# Patient Record
Sex: Female | Born: 1960 | Race: White | Hispanic: No | Marital: Married | Smoking: Never smoker
Health system: Southern US, Community
[De-identification: ages and names within clinical notes are randomized; demographics above are authoritative.]

## PROBLEM LIST (undated history)

## (undated) DIAGNOSIS — E785 Hyperlipidemia, unspecified: Secondary | ICD-10-CM

## (undated) DIAGNOSIS — M810 Age-related osteoporosis without current pathological fracture: Secondary | ICD-10-CM

## (undated) HISTORY — DX: Age-related osteoporosis without current pathological fracture: M81.0

## (undated) HISTORY — DX: Hyperlipidemia, unspecified: E78.5

---

## 1998-02-15 ENCOUNTER — Other Ambulatory Visit: Admission: RE | Admit: 1998-02-15 | Discharge: 1998-02-15 | Payer: Self-pay | Admitting: Obstetrics and Gynecology

## 1998-10-07 ENCOUNTER — Ambulatory Visit (HOSPITAL_COMMUNITY): Admission: RE | Admit: 1998-10-07 | Discharge: 1998-10-07 | Payer: Self-pay | Admitting: Obstetrics and Gynecology

## 1999-03-15 ENCOUNTER — Other Ambulatory Visit: Admission: RE | Admit: 1999-03-15 | Discharge: 1999-03-15 | Payer: Self-pay | Admitting: Obstetrics and Gynecology

## 1999-10-10 ENCOUNTER — Encounter: Payer: Self-pay | Admitting: Obstetrics and Gynecology

## 1999-10-10 ENCOUNTER — Ambulatory Visit (HOSPITAL_COMMUNITY): Admission: RE | Admit: 1999-10-10 | Discharge: 1999-10-10 | Payer: Self-pay | Admitting: Obstetrics and Gynecology

## 2000-04-26 ENCOUNTER — Other Ambulatory Visit: Admission: RE | Admit: 2000-04-26 | Discharge: 2000-04-26 | Payer: Self-pay | Admitting: Obstetrics and Gynecology

## 2000-09-23 ENCOUNTER — Encounter (INDEPENDENT_AMBULATORY_CARE_PROVIDER_SITE_OTHER): Payer: Self-pay | Admitting: Specialist

## 2000-09-23 ENCOUNTER — Ambulatory Visit (HOSPITAL_COMMUNITY): Admission: RE | Admit: 2000-09-23 | Discharge: 2000-09-23 | Payer: Self-pay | Admitting: Gastroenterology

## 2000-11-13 ENCOUNTER — Encounter: Payer: Self-pay | Admitting: Obstetrics and Gynecology

## 2000-11-13 ENCOUNTER — Ambulatory Visit (HOSPITAL_COMMUNITY): Admission: RE | Admit: 2000-11-13 | Discharge: 2000-11-13 | Payer: Self-pay

## 2000-11-25 ENCOUNTER — Encounter: Admission: RE | Admit: 2000-11-25 | Discharge: 2000-11-25 | Payer: Self-pay | Admitting: Obstetrics and Gynecology

## 2000-11-25 ENCOUNTER — Encounter: Payer: Self-pay | Admitting: Obstetrics and Gynecology

## 2001-06-03 ENCOUNTER — Other Ambulatory Visit: Admission: RE | Admit: 2001-06-03 | Discharge: 2001-06-03 | Payer: Self-pay | Admitting: Obstetrics and Gynecology

## 2002-01-01 ENCOUNTER — Ambulatory Visit (HOSPITAL_COMMUNITY): Admission: RE | Admit: 2002-01-01 | Discharge: 2002-01-01 | Payer: Self-pay | Admitting: Obstetrics and Gynecology

## 2002-01-01 ENCOUNTER — Encounter: Payer: Self-pay | Admitting: Obstetrics and Gynecology

## 2002-01-15 ENCOUNTER — Encounter: Admission: RE | Admit: 2002-01-15 | Discharge: 2002-01-15 | Payer: Self-pay | Admitting: Obstetrics and Gynecology

## 2002-01-15 ENCOUNTER — Encounter: Payer: Self-pay | Admitting: Obstetrics and Gynecology

## 2002-06-22 ENCOUNTER — Other Ambulatory Visit: Admission: RE | Admit: 2002-06-22 | Discharge: 2002-06-22 | Payer: Self-pay | Admitting: Obstetrics and Gynecology

## 2003-01-22 ENCOUNTER — Ambulatory Visit (HOSPITAL_COMMUNITY): Admission: RE | Admit: 2003-01-22 | Discharge: 2003-01-22 | Payer: Self-pay | Admitting: Obstetrics and Gynecology

## 2003-01-22 ENCOUNTER — Encounter: Payer: Self-pay | Admitting: Obstetrics and Gynecology

## 2003-06-28 ENCOUNTER — Other Ambulatory Visit: Admission: RE | Admit: 2003-06-28 | Discharge: 2003-06-28 | Payer: Self-pay | Admitting: Obstetrics and Gynecology

## 2004-01-14 ENCOUNTER — Encounter: Admission: RE | Admit: 2004-01-14 | Discharge: 2004-01-14 | Payer: Self-pay | Admitting: *Deleted

## 2004-01-27 ENCOUNTER — Encounter (INDEPENDENT_AMBULATORY_CARE_PROVIDER_SITE_OTHER): Payer: Self-pay | Admitting: *Deleted

## 2004-01-27 ENCOUNTER — Ambulatory Visit (HOSPITAL_COMMUNITY): Admission: RE | Admit: 2004-01-27 | Discharge: 2004-01-27 | Payer: Self-pay | Admitting: Gastroenterology

## 2004-08-14 ENCOUNTER — Other Ambulatory Visit: Admission: RE | Admit: 2004-08-14 | Discharge: 2004-08-14 | Payer: Self-pay | Admitting: Obstetrics and Gynecology

## 2005-01-18 ENCOUNTER — Ambulatory Visit (HOSPITAL_COMMUNITY): Admission: RE | Admit: 2005-01-18 | Discharge: 2005-01-18 | Payer: Self-pay | Admitting: Obstetrics and Gynecology

## 2005-09-26 ENCOUNTER — Other Ambulatory Visit: Admission: RE | Admit: 2005-09-26 | Discharge: 2005-09-26 | Payer: Self-pay | Admitting: Obstetrics and Gynecology

## 2006-01-23 ENCOUNTER — Ambulatory Visit (HOSPITAL_COMMUNITY): Admission: RE | Admit: 2006-01-23 | Discharge: 2006-01-23 | Payer: Self-pay | Admitting: Obstetrics and Gynecology

## 2007-01-28 ENCOUNTER — Ambulatory Visit (HOSPITAL_COMMUNITY): Admission: RE | Admit: 2007-01-28 | Discharge: 2007-01-28 | Payer: Self-pay | Admitting: Obstetrics and Gynecology

## 2007-01-31 ENCOUNTER — Encounter: Admission: RE | Admit: 2007-01-31 | Discharge: 2007-01-31 | Payer: Self-pay | Admitting: Orthopedic Surgery

## 2007-03-03 ENCOUNTER — Encounter: Admission: RE | Admit: 2007-03-03 | Discharge: 2007-03-03 | Payer: Self-pay | Admitting: Orthopedic Surgery

## 2008-02-02 ENCOUNTER — Ambulatory Visit (HOSPITAL_COMMUNITY): Admission: RE | Admit: 2008-02-02 | Discharge: 2008-02-02 | Payer: Self-pay | Admitting: Obstetrics and Gynecology

## 2008-02-06 ENCOUNTER — Encounter: Admission: RE | Admit: 2008-02-06 | Discharge: 2008-02-06 | Payer: Self-pay | Admitting: Obstetrics and Gynecology

## 2009-02-02 ENCOUNTER — Encounter: Admission: RE | Admit: 2009-02-02 | Discharge: 2009-02-02 | Payer: Self-pay | Admitting: Obstetrics and Gynecology

## 2009-02-08 ENCOUNTER — Encounter: Admission: RE | Admit: 2009-02-08 | Discharge: 2009-02-08 | Payer: Self-pay | Admitting: Obstetrics and Gynecology

## 2010-02-14 ENCOUNTER — Ambulatory Visit (HOSPITAL_COMMUNITY): Admission: RE | Admit: 2010-02-14 | Discharge: 2010-02-14 | Payer: Self-pay | Admitting: Obstetrics and Gynecology

## 2010-10-08 ENCOUNTER — Encounter: Payer: Self-pay | Admitting: Obstetrics and Gynecology

## 2010-10-09 ENCOUNTER — Encounter: Payer: Self-pay | Admitting: Obstetrics and Gynecology

## 2011-01-12 ENCOUNTER — Other Ambulatory Visit (HOSPITAL_COMMUNITY): Payer: Self-pay | Admitting: Obstetrics and Gynecology

## 2011-01-12 DIAGNOSIS — Z1231 Encounter for screening mammogram for malignant neoplasm of breast: Secondary | ICD-10-CM

## 2011-02-02 NOTE — Procedures (Signed)
[facility]  Patient:    Tina Woodard, Tina Woodard                      MRN: [id number] Proc. Date: 09/23/00 Adm. Date:  [id number] Attending:  Rich Brave CC:         Duke Salvia. Marcelle Overlie, M.D.   Procedure Report  PROCEDURE PERFORMED:  Colonoscopy with biopsies and polypectomy.  ENDOSCOPIST:  Florencia Reasons, M.D.  INDICATIONS:  A 50 year old female with long-standing ulcerative colitis, in active times many years and off all medications for several years.  FINDINGS:  Three small polyps.  No evidence of active ulcerative colitis.  DESCRIPTION OF PROCEDURE:  The nature, purpose, and risk of the procedure had been discussed with the patient and she provided written consent.  Sedation was fentanyl 87.5 mcg and Versed 8 mg IV prior to entering the course of the procedure without arrhythmias or desaturation.  The procedure was initiated with the Olympus pediatric video colonoscope, but this was too flexible and I was unable to get beyond roughly the splenic flexure, so we switched to the adult colonoscope.  Even with that, the patient appeared to have a somewhat redundant and floppy colon which tended to form quite a few loops.  By having the patient in the supine position and applying suprapubic abdominal pressure, we were able to get around the last couple of turns and then advanced quite easily into the cecum and for a short distance into a normal appearing terminal ileum, where upon pull back was initiated. The quality of the prep was excellent and it is felt that all areas were well seen.  There were three polyps removed during this exam.  All were small and sessile. The first was a 3 mm semipedunculated polyp near the splenic flexure which I snared off and the second was a small flatter 4 mm polyp snared off near the hepatic flexure.  There was complete hemostasis and no evidence of excessive cautery at these sites.  There was also a diminutive  hyperplastic appearing 2-3 mm polyp removed by a single colon biopsy at about 25 cm.  There was no evidence of any past or present ulcerative colitis.  The colonic mucosa looked completely normal, in particular in the rectal area where there was a very normal-appearing vascular pattern.  No erythema, friability, exudate, granularity or other abnormalities were seen.  There was no diverticulosis. Retroflexion was not performed due to a small rectal ampulla.  Random mucosal biopsies were obtained along the length of the colon.  This is check for evidence of dysplasia.  The scope is then removed from the patient who tolerated the procedure well and without apparent complications.  IMPRESSION: 1. Three small polyps removed as described above. 2. No evidence of active ulcerative colitis colonoscopically. 3. Pathology pending on random biopsies.  PLAN:  Unless dysplasia is present, I would think we can wait somewhere between two and three years before next performing a colonoscopy on this patient.  Perhaps we could wait even longer in view of her benign clinical course, would suggest that she might be a somewhat less risk for ulcerative colitis than even the average patient with ulcerative colitis. DD:  09/23/00 TD:  09/23/00 Job: [id number]

## 2011-02-02 NOTE — Op Note (Signed)
Tina Woodard, Tina Woodard                         ACCOUNT NO.:  [id number]   MEDICAL RECORD NO.:  [id number]                   PATIENT TYPE:  AMB   LOCATION:  ENDO                                 FACILITY:  [facility]   PHYSICIAN:  Bernette Redbird, M.D.                DATE OF BIRTH:  1961-07-18   DATE OF PROCEDURE:  01/27/2004  DATE OF DISCHARGE:                                 OPERATIVE REPORT   PROCEDURE:  Colonoscopy with biopsies.   INDICATION:  Clinically quiescent ulcerative colitis of longstanding, off  medications for many years without clinical recurrence, for dysplasia  surveillance.  Most recent colonoscopy, about 3 years ago, was negative for  dysplasia.   FINDINGS:  Normal exam to the terminal ileum.  Multiple biopsies obtained.   DESCRIPTION OF PROCEDURE:  The nature, purpose, and risks of the procedure  were familiar to the patient from prior examinations, and she provided  written consent.  Sedation was fentanyl 75 mcg, Versed 9 mg IV without  arrhythmias or desaturation.  The Olympus adult video colonoscope was used  for this procedure based on previous experience indicating that the  pediatric colonoscope was too flexible.  With external abdominal compression  and the patient in the supine position, I reached the cecum and entered the  terminal ileum for a short distance.  It had a normal appearance, and  pullback was then performed around the colon.   This was a normal examination.  No polyps, cancer, colitis, vascular  malformations, or diverticulosis were noted.  In particular, the vascular  mucosal pattern was entirely normal without any evidence of ongoing colitis  or proctitis.  Retroflexion was not performed in the rectum, but  reinspection of the rectum was unremarkable.  Random mucosal biopsies were  obtained along the length of the colon.   The patient tolerated the procedure well, and there were no apparent  complications.   IMPRESSION:  Normal exam.  No  evidence of active colitis.   PLAN:  Await biopsies for dysplasia surveillance.  Probable colonoscopic  follow up in 3 years or so (in view of the clinical inactivity, the patient  would prefer less frequent surveillance exams than the standard 2 year  interval).                                               Bernette Redbird, M.D.    RB/MEDQ  D:  01/27/2004  T:  01/27/2004  Job:  [id number]   cc:   Duke Salvia. Marcelle Overlie, M.D.  [address] [place]  [place] [address]  Fax: [phone]

## 2011-02-21 ENCOUNTER — Ambulatory Visit (HOSPITAL_COMMUNITY): Admission: RE | Admit: 2011-02-21 | Payer: BC Managed Care – PPO | Source: Ambulatory Visit

## 2011-03-01 ENCOUNTER — Ambulatory Visit (HOSPITAL_COMMUNITY)
Admission: RE | Admit: 2011-03-01 | Discharge: 2011-03-01 | Disposition: A | Discharge status: Home / Self Care | Payer: BC Managed Care – PPO | Source: Ambulatory Visit | Attending: Obstetrics and Gynecology | Admitting: Obstetrics and Gynecology

## 2011-03-01 DIAGNOSIS — Z1231 Encounter for screening mammogram for malignant neoplasm of breast: Secondary | ICD-10-CM | POA: Insufficient documentation

## 2012-01-14 ENCOUNTER — Other Ambulatory Visit: Payer: Self-pay | Admitting: Gastroenterology

## 2012-02-06 ENCOUNTER — Other Ambulatory Visit (HOSPITAL_COMMUNITY): Payer: Self-pay | Admitting: Obstetrics and Gynecology

## 2012-02-06 DIAGNOSIS — Z1231 Encounter for screening mammogram for malignant neoplasm of breast: Secondary | ICD-10-CM

## 2012-03-03 ENCOUNTER — Ambulatory Visit (HOSPITAL_COMMUNITY)
Admission: RE | Admit: 2012-03-03 | Discharge: 2012-03-03 | Disposition: A | Discharge status: Home / Self Care | Payer: BC Managed Care – PPO | Source: Ambulatory Visit | Attending: Obstetrics and Gynecology | Admitting: Obstetrics and Gynecology

## 2012-03-03 DIAGNOSIS — Z1231 Encounter for screening mammogram for malignant neoplasm of breast: Secondary | ICD-10-CM | POA: Insufficient documentation

## 2013-03-23 ENCOUNTER — Other Ambulatory Visit (HOSPITAL_COMMUNITY): Payer: Self-pay | Admitting: Obstetrics and Gynecology

## 2013-03-23 DIAGNOSIS — Z1231 Encounter for screening mammogram for malignant neoplasm of breast: Secondary | ICD-10-CM

## 2013-04-02 ENCOUNTER — Ambulatory Visit (HOSPITAL_COMMUNITY)
Admission: RE | Admit: 2013-04-02 | Discharge: 2013-04-02 | Disposition: A | Discharge status: Home / Self Care | Payer: BC Managed Care – PPO | Source: Ambulatory Visit | Attending: Obstetrics and Gynecology | Admitting: Obstetrics and Gynecology

## 2013-04-02 DIAGNOSIS — Z1231 Encounter for screening mammogram for malignant neoplasm of breast: Secondary | ICD-10-CM | POA: Insufficient documentation

## 2014-03-01 ENCOUNTER — Other Ambulatory Visit (HOSPITAL_COMMUNITY): Payer: Self-pay | Admitting: Obstetrics and Gynecology

## 2014-03-01 DIAGNOSIS — Z1231 Encounter for screening mammogram for malignant neoplasm of breast: Secondary | ICD-10-CM

## 2014-04-13 ENCOUNTER — Ambulatory Visit (HOSPITAL_COMMUNITY): Payer: BC Managed Care – PPO

## 2014-04-19 ENCOUNTER — Ambulatory Visit (HOSPITAL_COMMUNITY)
Admission: RE | Admit: 2014-04-19 | Discharge: 2014-04-19 | Disposition: A | Discharge status: Home / Self Care | Payer: BC Managed Care – PPO | Source: Ambulatory Visit | Attending: Obstetrics and Gynecology | Admitting: Obstetrics and Gynecology

## 2014-04-19 DIAGNOSIS — Z1231 Encounter for screening mammogram for malignant neoplasm of breast: Secondary | ICD-10-CM | POA: Insufficient documentation

## 2015-01-28 ENCOUNTER — Other Ambulatory Visit: Payer: Self-pay | Admitting: Obstetrics and Gynecology

## 2015-01-31 LAB — CYTOLOGY - PAP

## 2015-04-13 ENCOUNTER — Other Ambulatory Visit (HOSPITAL_COMMUNITY): Payer: Self-pay | Admitting: Obstetrics and Gynecology

## 2015-04-13 DIAGNOSIS — Z1231 Encounter for screening mammogram for malignant neoplasm of breast: Secondary | ICD-10-CM

## 2015-04-22 ENCOUNTER — Ambulatory Visit (HOSPITAL_COMMUNITY)
Admission: RE | Admit: 2015-04-22 | Discharge: 2015-04-22 | Disposition: A | Discharge status: Home / Self Care | Payer: BC Managed Care – PPO | Source: Ambulatory Visit | Attending: Obstetrics and Gynecology | Admitting: Obstetrics and Gynecology

## 2015-04-22 DIAGNOSIS — Z1231 Encounter for screening mammogram for malignant neoplasm of breast: Secondary | ICD-10-CM | POA: Insufficient documentation

## 2016-04-03 ENCOUNTER — Encounter: Payer: Self-pay | Admitting: Family Medicine

## 2016-04-03 ENCOUNTER — Ambulatory Visit (INDEPENDENT_AMBULATORY_CARE_PROVIDER_SITE_OTHER): Payer: BC Managed Care – PPO | Admitting: Family Medicine

## 2016-04-03 VITALS — BP 109/76 | HR 75 | Ht 67.0 in | Wt 135.0 lb

## 2016-04-03 DIAGNOSIS — M7662 Achilles tendinitis, left leg: Secondary | ICD-10-CM | POA: Diagnosis not present

## 2016-04-03 MED ORDER — NITROGLYCERIN 0.2 MG/HR TD PT24: MEDICATED_PATCH | TRANSDERMAL | Status: DC

## 2016-04-03 NOTE — Patient Instructions (Addendum)
You have Achilles Tendinitis Ibuprofen 600mg  three times a day with food OR aleve 2 tabs twice a day with food for pain and inflammation. Calf raises 3 sets of 10 on level ground once a day first. When these are easy, can do them one legged 3 sets of 10. Finally advance to doing them on a step. Can add heel walks, toe walks forward and backward as well Hold stretch for 20-30 seconds, repeat 3 times once or twice a day. Icing 15 minutes at a time 3-4 times a day. Avoid uneven ground, hills as much as possible. Focus on swimming, cycling for exercise. Nitro patches 1/4th patch to affected achilles, change daily. Try bunion pads over the back of this to help ease the compression/rubbing on the bump you have here. I would rest this as much as possible. Heel lifts in shoes or shoes with a natural heel lift. Consider physical therapy, orthotics if not improving as expected. Follow up in 6 weeks.

## 2016-04-04 ENCOUNTER — Encounter: Payer: Self-pay | Admitting: Family Medicine

## 2016-04-04 DIAGNOSIS — M7662 Achilles tendinitis, left leg: Secondary | ICD-10-CM | POA: Insufficient documentation

## 2016-04-04 NOTE — Assessment & Plan Note (Signed)
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  Ibuprofen or aleve, icing regularly.  Shown home exercises to do daily.  Heel lifts.  Avoid flat shoes, barefoot walking.  Start nitro patches - discussed risks of headache, skin irritation.  Try a bunion pad to cushion area.  Consider physical therapy, orthotics if not improving.  F/u in 6 weeks.

## 2016-04-04 NOTE — Progress Notes (Signed)
PCP: Dr Merri Brunetteandace Smith   Subjective:   HPI: Patient is a 55 y.o. female here for left heel pain.  Patient reports for about 8-10 days she's had worsening left posterior heel pain. Pain is 0/10 at rest, up to 3/10 with pressure on this area and with a lot of walking, sharp. No injury or trauma. Has been walking 8-10 miles wearing hiking boots once a week in preparation for hiking in [place] in 2 weeks. Has been stretching, icing, resting. No skin changes, numbness. Remote history of plantar fasciitis.  Past Medical History  Diagnosis Date  . Hyperlipidemia   . Osteoporosis     No current outpatient prescriptions on file prior to visit.   No current facility-administered medications on file prior to visit.    No past surgical history on file.  No Known Allergies  Social History   Social History  . Marital Status: Married    Spouse Name: N/A  . Number of Children: N/A  . Years of Education: N/A   Occupational History  . Not on file.   Social History Main Topics  . Smoking status: Never Smoker   . Smokeless tobacco: Not on file  . Alcohol Use: Not on file  . Drug Use: Not on file  . Sexual Activity: Not on file   Other Topics Concern  . Not on file   Social History Narrative    No family history on file.  BP 109/76 mmHg  Pulse 75  Ht 5\' 7"  (1.702 m)  Wt 135 lb (61.236 kg)  BMI 21.14 kg/m2  LMP 02/24/2012  Review of Systems: See HPI above.    Objective:  Physical Exam:  Gen: NAD, comfortable in exam room  Left foot/ankle: Haglunds deformity.  No bruising, swelling, other deformity. FROM with pain on full plantarflexion and dorsiflexion. TTP insertion of achilles posteriorly.  No other tenderness. Negative ant drawer and talar tilt.   Negative syndesmotic compression. Negative calcaneal squeeze. Thompsons test negative. NV intact distally.    Right foot/ankle: FROM without pain.  Assessment & Plan:  1. Left insertional achilles tendinitis -  with haglund's deformity.  Ibuprofen or aleve, icing regularly.  Shown home exercises to do daily.  Heel lifts.  Avoid flat shoes, barefoot walking.  Start nitro patches - discussed risks of headache, skin irritation.  Try a bunion pad to cushion area.  Consider physical therapy, orthotics if not improving.  F/u in 6 weeks.

## 2016-05-16 ENCOUNTER — Ambulatory Visit: Payer: BC Managed Care – PPO | Admitting: Family Medicine

## 2019-03-24 DIAGNOSIS — K589 Irritable bowel syndrome without diarrhea: Secondary | ICD-10-CM | POA: Insufficient documentation

## 2021-08-24 ENCOUNTER — Emergency Department (HOSPITAL_COMMUNITY): Payer: BC Managed Care – PPO

## 2021-08-24 ENCOUNTER — Other Ambulatory Visit: Payer: Self-pay

## 2021-08-24 ENCOUNTER — Encounter (HOSPITAL_COMMUNITY): Payer: Self-pay

## 2021-08-24 ENCOUNTER — Observation Stay (HOSPITAL_COMMUNITY)
Admission: EM | Admit: 2021-08-24 | Discharge: 2021-08-25 | Disposition: A | Discharge status: Home / Self Care | Payer: BC Managed Care – PPO | Attending: General Surgery | Admitting: General Surgery

## 2021-08-24 DIAGNOSIS — S301XXA Contusion of abdominal wall, initial encounter: Secondary | ICD-10-CM | POA: Diagnosis not present

## 2021-08-24 DIAGNOSIS — D62 Acute posthemorrhagic anemia: Secondary | ICD-10-CM | POA: Insufficient documentation

## 2021-08-24 DIAGNOSIS — Y92410 Unspecified street and highway as the place of occurrence of the external cause: Secondary | ICD-10-CM | POA: Insufficient documentation

## 2021-08-24 DIAGNOSIS — S3991XA Unspecified injury of abdomen, initial encounter: Secondary | ICD-10-CM | POA: Diagnosis present

## 2021-08-24 LAB — COMPREHENSIVE METABOLIC PANEL
ALT: 25 U/L (ref 0–44)
AST: 40 U/L (ref 15–41)
Albumin: 4.4 g/dL (ref 3.5–5.0)
Alkaline Phosphatase: 57 U/L (ref 38–126)
Anion gap: 12 (ref 5–15)
BUN: 17 mg/dL (ref 8–23)
CO2: 20 mmol/L — ABNORMAL LOW (ref 22–32)
Calcium: 8.9 mg/dL (ref 8.9–10.3)
Chloride: 104 mmol/L (ref 98–111)
Creatinine, Ser: 0.78 mg/dL (ref 0.44–1.00)
GFR, Estimated: >60 mL/min (ref >60)
Glucose, Bld: 97 mg/dL (ref 70–99)
Potassium: 4.3 mmol/L (ref 3.5–5.1)
Sodium: 136 mmol/L (ref 135–145)
Total Bilirubin: 0.9 mg/dL (ref 0.3–1.2)
Total Protein: 7.2 g/dL (ref 6.5–8.1)

## 2021-08-24 LAB — CBC WITH DIFFERENTIAL/PLATELET
Abs Immature Granulocytes: 0.13 10*3/uL — ABNORMAL HIGH (ref 0.00–0.07)
Basophils Absolute: 0.1 10*3/uL (ref 0.0–0.1)
Basophils Relative: 1 %
Eosinophils Absolute: 0 10*3/uL (ref 0.0–0.5)
Eosinophils Relative: 0 %
HCT: 43.4 % (ref 36.0–46.0)
Hemoglobin: 13.8 g/dL (ref 12.0–15.0)
Immature Granulocytes: 1 %
Lymphocytes Relative: 7 %
Lymphs Abs: 1.2 10*3/uL (ref 0.7–4.0)
MCH: 31.7 pg (ref 26.0–34.0)
MCHC: 31.8 g/dL (ref 30.0–36.0)
MCV: 99.5 fL (ref 80.0–100.0)
Monocytes Absolute: 0.8 10*3/uL (ref 0.1–1.0)
Monocytes Relative: 5 %
Neutro Abs: 15 10*3/uL — ABNORMAL HIGH (ref 1.7–7.7)
Neutrophils Relative %: 86 %
Platelets: 252 10*3/uL (ref 150–400)
RBC: 4.36 MIL/uL (ref 3.87–5.11)
RDW: 14.8 % (ref 11.5–15.5)
WBC: 17.2 10*3/uL — ABNORMAL HIGH (ref 4.0–10.5)
nRBC: 0 % (ref 0.0–0.2)

## 2021-08-24 LAB — TYPE AND SCREEN
ABO/RH(D): O POS
Antibody Screen: NEGATIVE

## 2021-08-24 MED ORDER — ONDANSETRON HCL 4 MG/2ML IJ SOLN: 4.0000 mg | Freq: Four times a day (QID) | INTRAMUSCULAR | Status: DC | PRN

## 2021-08-24 MED ORDER — HYDROMORPHONE HCL 1 MG/ML IJ SOLN: 1.0000 mg | INTRAMUSCULAR | Status: DC | PRN

## 2021-08-24 MED ORDER — IOHEXOL 350 MG/ML SOLN
80.0000 mL | Freq: Once | INTRAVENOUS | Status: AC | PRN
  Administered 2021-08-24: 80 mL via INTRAVENOUS

## 2021-08-24 MED ORDER — SODIUM CHLORIDE 0.9 % IV BOLUS
1000.0000 mL | Freq: Once | INTRAVENOUS | Status: AC
  Administered 2021-08-24: 1000 mL via INTRAVENOUS

## 2021-08-24 MED ORDER — DEXTROSE-NACL 5-0.9 % IV SOLN: INTRAVENOUS | Status: DC

## 2021-08-24 MED ORDER — OXYCODONE HCL 5 MG PO TABS: 5.0000 mg | ORAL_TABLET | ORAL | Status: DC | PRN

## 2021-08-24 MED ORDER — MELATONIN 3 MG PO TABS: 3.0000 mg | ORAL_TABLET | Freq: Every evening | ORAL | Status: DC | PRN

## 2021-08-24 MED ORDER — ONDANSETRON 4 MG PO TBDP: 4.0000 mg | ORAL_TABLET | Freq: Four times a day (QID) | ORAL | Status: DC | PRN
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
## 2021-08-24 NOTE — ED Notes (Signed)
Pt taken to CT.
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
## 2021-08-25 ENCOUNTER — Encounter (HOSPITAL_COMMUNITY): Payer: Self-pay

## 2021-08-25 LAB — HIV ANTIBODY (ROUTINE TESTING W REFLEX): HIV Screen 4th Generation wRfx: NONREACTIVE

## 2021-08-25 LAB — ABO/RH: ABO/RH(D): O POS

## 2021-08-25 LAB — CBC
HCT: 34.3 % — ABNORMAL LOW (ref 36.0–46.0)
Hemoglobin: 11.1 g/dL — ABNORMAL LOW (ref 12.0–15.0)
MCH: 31.3 pg (ref 26.0–34.0)
MCHC: 32.4 g/dL (ref 30.0–36.0)
MCV: 96.6 fL (ref 80.0–100.0)
Platelets: 220 10*3/uL (ref 150–400)
RBC: 3.55 MIL/uL — ABNORMAL LOW (ref 3.87–5.11)
RDW: 13.2 % (ref 11.5–15.5)
WBC: 7 10*3/uL (ref 4.0–10.5)
nRBC: 0 % (ref 0.0–0.2)

## 2021-08-25 MED ORDER — OXYCODONE HCL 5 MG PO TABS: 5.0000 mg | ORAL_TABLET | Freq: Four times a day (QID) | ORAL | 0 refills | Status: AC | PRN

## 2021-08-25 MED ORDER — ACETAMINOPHEN 325 MG PO TABS: 650.0000 mg | ORAL_TABLET | Freq: Four times a day (QID) | ORAL | Status: DC | PRN

## 2021-08-25 MED ORDER — ACETAMINOPHEN 325 MG PO TABS: 650.0000 mg | ORAL_TABLET | Freq: Four times a day (QID) | ORAL | Status: AC | PRN

## 2021-08-25 NOTE — Discharge Instructions (Signed)
Continue ice packs to abdominal wall hematoma and wear abdominal binder for compression Bruising on skin may look worse before it looks better Monitor for signs of anemia/ blood loss (fatigue, dizziness/ lightheadedness, racing heart beat, low blood pressure) Follow up with your primary care physician
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
## 2022-05-24 NOTE — Progress Notes (Signed)
[facility] - Clinic Note  05/25/2022     CHIEF COMPLAINT Patient presents for Retina Evaluation   HISTORY OF PRESENT ILLNESS: Tina Woodard is a 61 y.o. female who presents to the clinic today for:   HPI     Retina Evaluation   In both eyes.  Associated Symptoms Floaters.  I, the attending physician,  performed the HPI with the patient and updated documentation appropriately.        Comments   Patient here for Retinal Evaluation. Referred by Dr Zenaida Niece. Patient states vision doing alright. Has had more floaters the last year or 2. No eye pain.      Last edited by Rennis Chris, MD on 05/25/2022 11:59 PM.    Patient referred by Dr. Zenaida Niece for possible retinal hole. Has had more floaters in the last 1-2 years. Patient had flashes a couple of years ago, but reports no recent symptoms of flashes.    Referring physician: Diona Foley, MD [address] [place],  [place] [address]  HISTORICAL INFORMATION:   Selected notes from the MEDICAL RECORD NUMBER Referred by Dr. Zenaida Niece for concern of retinal hole LEE:  Ocular Hx- PMH-    CURRENT MEDICATIONS: No current outpatient medications on file. (Ophthalmic Drugs)   No current facility-administered medications for this visit. (Ophthalmic Drugs)   Current Outpatient Medications (Other)  Medication Sig   cholecalciferol (VITAMIN D3) 25 MCG (1000 UNIT) tablet Take 1,000 Units by mouth daily.   MAGNESIUM PO Take 250-500 mg by mouth daily.   Multiple Vitamin (MULTIVITAMIN WITH MINERALS) TABS tablet Take 1 tablet by mouth daily.   pravastatin (PRAVACHOL) 20 MG tablet Take 20 mg by mouth daily.   acetaminophen (TYLENOL) 325 MG tablet Take 2 tablets (650 mg total) by mouth every 6 (six) hours as needed for mild pain. (Patient not taking: Reported on 05/25/2022)   oxyCODONE (OXY IR/ROXICODONE) 5 MG immediate release tablet Take 1 tablet (5 mg total) by mouth every 6 (six) hours as needed for severe pain. (Patient not  taking: Reported on 05/25/2022)   No current facility-administered medications for this visit. (Other)   REVIEW OF SYSTEMS: ROS   Positive for: Gastrointestinal, Musculoskeletal, Eyes Negative for: Constitutional, Neurological, Skin, Genitourinary, HENT, Endocrine, Cardiovascular, Respiratory, Psychiatric, Allergic/Imm, Heme/Lymph Last edited by Annalee Genta D, COT on 05/25/2022 11:25 AM.     ALLERGIES No Known Allergies  PAST MEDICAL HISTORY Past Medical History:  Diagnosis Date   Hyperlipidemia    Osteoporosis    History reviewed. No pertinent surgical history.  FAMILY HISTORY Family History  Problem Relation Age of Onset   Diabetes Sister    Glaucoma Paternal Grandmother    Diabetes Paternal Grandmother    Glaucoma Paternal Grandfather    SOCIAL HISTORY Social History   Tobacco Use   Smoking status: Never  Vaping Use   Vaping Use: Never used  Substance Use Topics   Alcohol use: Yes    Comment: social   Drug use: Never       OPHTHALMIC EXAM:  Base Eye Exam     Visual Acuity (Snellen - Linear)       Right Left   Dist cc 20/20 20/40   Dist ph cc  20/20    Correction: Glasses  Usually wears soft contact lenses.        Tonometry (Tonopen, 9:34 AM)       Right Left   Pressure 15 13  Pupils       Dark Light Shape React APD   Right 3 2 Round Brisk None   Left 3 2 Round Brisk None         Visual Fields (Counting fingers)       Left Right    Full Full         Extraocular Movement       Right Left    Full, Ortho Full, Ortho         Neuro/Psych     Oriented x3: Yes   Mood/Affect: Normal         Dilation     Both eyes: 1.0% Mydriacyl, 2.5% Phenylephrine @ 9:34 AM           Slit Lamp and Fundus Exam     Slit Lamp Exam       Right Left   Lids/Lashes dermatochalasis dermatochalasis   Conjunctiva/Sclera white and quiet white and quiet, mild temporal pinguecula   Cornea mild arcus, trace PEE mild arcus, trace  tear film debirs   Anterior Chamber deep and clear deep and clear   Iris round and dilated round and dilated   Lens 2+ NS, 2+ cortical 2+ NS, 2+ cortical   Anterior Vitreous mild syneresis mild syneresis, PVD, weiss ring, vitreous condensations         Fundus Exam       Right Left   Disc pink and sharp, compact pink and sharp, compact, mild PPA   C/D Ratio 0.1 0.2   Macula flat, good foveal reflex, mild RPE mottling, no heme or edema flat, blunted foveal reflex, ERM with striae and lamellar hole   Vessels mild attenuation, mild cooper wiring, A/V crossing changes mild attenuation, mild cooper wiring   Periphery focal pre-retinal heme just above the disc, pigmented lattice at 0430, 0700-0730, focal hypopigmented CR atrophy at 0800 equator Pigmented lattice inferiorly from 0500 to 0700           Refraction     Wearing Rx       Sphere Cylinder Axis Add   Right -5.25 +2.00 098 +2.00   Left -6.75 +1.25 088 +2.00    Age: 23yrs         Manifest Refraction       Sphere Cylinder Axis Dist VA Add   Right -5.25 +2.00 098 20/20 +2.00   Left -6.50 +1.00 088 20/20 +2.00           IMAGING AND PROCEDURES  Imaging and Procedures for 05/25/2022  OCT, Retina - OU - Both Eyes       Right Eye Central Foveal Thickness: 245. Progression has no prior data. Findings include normal foveal contour, no IRF, no SRF (Partial PVD, trace vitreous opacities ).   Left Eye Central Foveal Thickness: 429. Progression has no prior data. Findings include no SRF, abnormal foveal contour, epiretinal membrane, intraretinal fluid, macular hole, preretinal fibrosis.   Notes *Images captured and stored on drive  Diagnosis / Impression:  OD: NFP, no IRF/SRF; partial PVD and trace vitreous opacities OS: ERM with lamellar macular hole with IRF, pucker and PRF; inferior VR tuft caught on widefield   Clinical management:  See below  Abbreviations: NFP - Normal foveal profile. CME - cystoid macular  edema. PED - pigment epithelial detachment. IRF - intraretinal fluid. SRF - subretinal fluid. EZ - ellipsoid zone. ERM - epiretinal membrane. ORA - outer retinal atrophy. ORT - outer retinal tubulation. SRHM - subretinal hyper-reflective material. IRHM -  intraretinal hyper-reflective material            ASSESSMENT/PLAN:    ICD-10-CM   1. Bilateral retinal lattice degeneration  H35.413     2. Retinal hole of both eyes  H33.323     3. Epiretinal membrane (ERM) of left eye  H35.372 OCT, Retina - OU - Both Eyes    4. Lamellar macular hole of left eye  H35.342 OCT, Retina - OU - Both Eyes    5. Combined forms of age-related cataract of both eyes  H25.813      1,2. Lattice degeneration w/ atrophic holes OU - OD: pigmented lattice at 0430, 0700 to 0730, and focal hypopigmented CR atrophy at 0800 equator;  - OS: pigmented lattice inferiorly from 0500 to 0700 - discussed findings, prognosis, and treatment options including observation - recommend non-urgent laser retinopexy OU - pt wishes to proceed with laser--will schedule wk of Sept 18th - f/u wk of 9.18.23 for laser retinopexy OD  3,4. Epiretinal membrane with lamellar hole OS - The natural history, anatomy, potential for loss of vision, and treatment options including vitrectomy techniques and the complications of endophthalmitis, retinal detachment, vitreous hemorrhage, cataract progression and permanent vision loss discussed with the patient. - mild ERM with lamellar hole and cystic changes - BCVA 20/20, asymptomatic, no metamorphopsia - no indication for surgery at this time - monitor for now  5. Mixed cataracts OU - The symptoms of cataract, surgical options, and treatments and risks were discussed with patient. - discussed diagnosis and progression - not yet visually significant - monitor for now  Ophthalmic Meds Ordered this visit:  No orders of the defined types were placed in this encounter.    Return in 10 days (on  06/04/2022) for week of 09.18.23 laser retinopexy OD.  There are no Patient Instructions on file for this visit.  Explained the diagnoses, plan, and follow up with the patient and they expressed understanding.  Patient expressed understanding of the importance of proper follow up care.  This document serves as a record of services personally performed by Karie Chimera, MD, PhD. It was created on their behalf by Glee Arvin. Manson Passey, OA an ophthalmic technician. The creation of this record is the provider's dictation and/or activities during the visit.    Electronically signed by: Glee Arvin. Manson Passey, [place] 09.07.2023 12:10 AM  This document serves as a record of services personally performed by Karie Chimera, MD, PhD. It was created on their behalf by Annalee Genta, COMT. The creation of this record is the provider's dictation and/or activities during the visit.  Electronically signed by: Annalee Genta, COMT 05/26/22 12:10 AM  Karie Chimera, M.D., Ph.D. Diseases & Surgery of the Retina and Vitreous [facility]  I have reviewed the above documentation for accuracy and completeness, and I agree with the above. Karie Chimera, M.D., Ph.D. 05/26/22 12:11 AM  Abbreviations: M myopia (nearsighted); A astigmatism; H hyperopia (farsighted); P presbyopia; Mrx spectacle prescription;  CTL contact lenses; OD right eye; OS left eye; OU both eyes  XT exotropia; ET esotropia; PEK punctate epithelial keratitis; PEE punctate epithelial erosions; DES dry eye syndrome; MGD meibomian gland dysfunction; ATs artificial tears; PFAT's preservative free artificial tears; NSC nuclear sclerotic cataract; PSC posterior subcapsular cataract; ERM epi-retinal membrane; PVD posterior vitreous detachment; RD retinal detachment; DM diabetes mellitus; DR diabetic retinopathy; NPDR non-proliferative diabetic retinopathy; PDR proliferative diabetic retinopathy; CSME clinically significant macular edema; DME diabetic  macular edema; dbh dot  blot hemorrhages; CWS cotton wool spot; POAG primary open angle glaucoma; C/D cup-to-disc ratio; HVF humphrey visual field; GVF goldmann visual field; OCT optical coherence tomography; IOP intraocular pressure; BRVO Branch retinal vein occlusion; CRVO central retinal vein occlusion; CRAO central retinal artery occlusion; BRAO branch retinal artery occlusion; RT retinal tear; SB scleral buckle; PPV pars plana vitrectomy; VH Vitreous hemorrhage; PRP panretinal laser photocoagulation; IVK intravitreal kenalog; VMT vitreomacular traction; MH Macular hole;  NVD neovascularization of the disc; NVE neovascularization elsewhere; AREDS age related eye disease study; ARMD age related macular degeneration; POAG primary open angle glaucoma; EBMD epithelial/anterior basement membrane dystrophy; ACIOL anterior chamber intraocular lens; IOL intraocular lens; PCIOL posterior chamber intraocular lens; Phaco/IOL phacoemulsification with intraocular lens placement; [place] photorefractive keratectomy; LASIK laser assisted in situ keratomileusis; HTN hypertension; DM diabetes mellitus; COPD chronic obstructive pulmonary disease

## 2022-05-25 ENCOUNTER — Ambulatory Visit (INDEPENDENT_AMBULATORY_CARE_PROVIDER_SITE_OTHER): Payer: BC Managed Care – PPO | Admitting: Ophthalmology

## 2022-05-25 ENCOUNTER — Encounter (INDEPENDENT_AMBULATORY_CARE_PROVIDER_SITE_OTHER): Payer: Self-pay | Admitting: Ophthalmology

## 2022-05-25 DIAGNOSIS — H3581 Retinal edema: Secondary | ICD-10-CM

## 2022-05-25 DIAGNOSIS — H33323 Round hole, bilateral: Secondary | ICD-10-CM | POA: Diagnosis not present

## 2022-05-25 DIAGNOSIS — H35342 Macular cyst, hole, or pseudohole, left eye: Secondary | ICD-10-CM | POA: Diagnosis not present

## 2022-05-25 DIAGNOSIS — H25813 Combined forms of age-related cataract, bilateral: Secondary | ICD-10-CM

## 2022-05-25 DIAGNOSIS — H35372 Puckering of macula, left eye: Secondary | ICD-10-CM

## 2022-05-25 DIAGNOSIS — H35413 Lattice degeneration of retina, bilateral: Secondary | ICD-10-CM

## 2022-06-01 NOTE — Progress Notes (Shared)
[facility] Clinic Note  06/04/2022     CHIEF COMPLAINT Patient presents for Retina Follow Up   HISTORY OF PRESENT ILLNESS: Tina Woodard is a 61 y.o. female who presents to the clinic today for:   HPI     Retina Follow Up   Patient presents with  Other.  In right eye.  Severity is moderate.  Duration of 1 week.  Since onset it is stable.  I, the attending physician,  performed the HPI with the patient and updated documentation appropriately.        Comments   Pt here for 2 wk ret f/u for bilateral lattice degen, retinopexy OD today. Pt states VA the same.       Last edited by Bernarda Caffey, MD on 06/04/2022  5:18 PM.    Patient here for laser retinopexy OD today, no change in vision   Referring physician: Carol Ada, MD [address] [place],  [place] [address]  HISTORICAL INFORMATION:   Selected notes from the MEDICAL RECORD NUMBER Referred by Dr. Lucianne Lei for concern of retinal hole LEE:  Ocular Hx- PMH-    CURRENT MEDICATIONS: Current Outpatient Medications (Ophthalmic Drugs)  Medication Sig   prednisoLONE acetate (PRED FORTE) 1 % ophthalmic suspension Place 1 drop into the right eye 4 (four) times daily for 7 days.   No current facility-administered medications for this visit. (Ophthalmic Drugs)   Current Outpatient Medications (Other)  Medication Sig   cholecalciferol (VITAMIN D3) 25 MCG (1000 UNIT) tablet Take 1,000 Units by mouth daily.   MAGNESIUM PO Take 250-500 mg by mouth daily.   Multiple Vitamin (MULTIVITAMIN WITH MINERALS) TABS tablet Take 1 tablet by mouth daily.   pravastatin (PRAVACHOL) 20 MG tablet Take 20 mg by mouth daily.   acetaminophen (TYLENOL) 325 MG tablet Take 2 tablets (650 mg total) by mouth every 6 (six) hours as needed for mild pain. (Patient not taking: Reported on 05/25/2022)   oxyCODONE (OXY IR/ROXICODONE) 5 MG immediate release tablet Take 1 tablet (5 mg total) by mouth every 6 (six) hours as  needed for severe pain. (Patient not taking: Reported on 05/25/2022)   No current facility-administered medications for this visit. (Other)   REVIEW OF SYSTEMS: ROS   Positive for: Gastrointestinal, Musculoskeletal, Eyes Negative for: Constitutional, Neurological, Skin, Genitourinary, HENT, Endocrine, Cardiovascular, Respiratory, Psychiatric, Allergic/Imm, Heme/Lymph Last edited by Kingsley Spittle, COT on 06/04/2022  1:48 PM.     ALLERGIES No Known Allergies  PAST MEDICAL HISTORY Past Medical History:  Diagnosis Date   Hyperlipidemia    Osteoporosis    History reviewed. No pertinent surgical history.  FAMILY HISTORY Family History  Problem Relation Age of Onset   Diabetes Sister    Glaucoma Paternal Grandmother    Diabetes Paternal Grandmother    Glaucoma Paternal Grandfather    SOCIAL HISTORY Social History   Tobacco Use   Smoking status: Never  Vaping Use   Vaping Use: Never used  Substance Use Topics   Alcohol use: Yes    Comment: social   Drug use: Never       OPHTHALMIC EXAM:  Base Eye Exam     Visual Acuity (Snellen - Linear)       Right Left   Dist cc 20/20 -1 20/25 +1    Correction: Glasses         Tonometry (Tonopen, 1:52 PM)       Right Left   Pressure 14 13  Pupils       Dark Light Shape React APD   Right 3 2 Round Brisk None   Left 3 2 Round Brisk None         Visual Fields (Counting fingers)       Left Right    Full Full         Extraocular Movement       Right Left    Full, Ortho Full, Ortho         Neuro/Psych     Oriented x3: Yes   Mood/Affect: Normal         Dilation     Right eye: 1.0% Mydriacyl, 2.5% Phenylephrine @ 1:52 PM           Slit Lamp and Fundus Exam     Slit Lamp Exam       Right Left   Lids/Lashes dermatochalasis dermatochalasis   Conjunctiva/Sclera white and quiet white and quiet, mild temporal pinguecula   Cornea mild arcus, trace PEE mild arcus, trace tear film  debirs   Anterior Chamber deep and clear deep and clear   Iris round and dilated round and dilated   Lens 2+ NS, 2+ cortical 2+ NS, 2+ cortical   Anterior Vitreous mild syneresis mild syneresis, PVD, weiss ring, vitreous condensations         Fundus Exam       Right Left   Disc pink and sharp, compact pink and sharp, compact, mild PPA   C/D Ratio 0.1 0.2   Macula flat, good foveal reflex, mild RPE mottling, no heme or edema flat, blunted foveal reflex, ERM with striae and lamellar hole   Vessels mild attenuation, mild cooper wiring, A/V crossing changes mild attenuation, mild cooper wiring   Periphery Attached, focal pre-retinal heme just above the disc, patches of pigmented lattice from 0530-0700, and at 1100; focal hypopigmented CR atrophy at 0800 equator Pigmented lattice inferiorly from 0500 to 0700           Refraction     Wearing Rx       Sphere Cylinder Axis Add   Right -5.25 +2.00 098 +2.00   Left -6.75 +1.25 088 +2.00           IMAGING AND PROCEDURES  Imaging and Procedures for 06/04/2022  OCT, Retina - OU - Both Eyes       Right Eye Central Foveal Thickness: 247. Progression has been stable. Findings include normal foveal contour, no IRF, no SRF (Partial PVD, trace vitreous opacities ).   Left Eye Central Foveal Thickness: 432. Progression has been stable. Findings include no SRF, abnormal foveal contour, epiretinal membrane, intraretinal fluid, macular hole, preretinal fibrosis (ERM with lamellar macular hole with IRF, pucker and PRF; inferior VR tuft caught on widefield).   Notes *Images captured and stored on drive  Diagnosis / Impression:  OD: NFP, no IRF/SRF; partial PVD and trace vitreous opacities OS: ERM with lamellar macular hole with IRF, pucker and PRF; inferior VR tuft caught on widefield   Clinical management:  See below  Abbreviations: NFP - Normal foveal profile. CME - cystoid macular edema. PED - pigment epithelial detachment. IRF -  intraretinal fluid. SRF - subretinal fluid. EZ - ellipsoid zone. ERM - epiretinal membrane. ORA - outer retinal atrophy. ORT - outer retinal tubulation. SRHM - subretinal hyper-reflective material. IRHM - intraretinal hyper-reflective material      Repair Retinal Breaks, Laser - OD - Right Eye       LASER  PROCEDURE NOTE  Procedure:  Barrier laser retinopexy using slit lamp laser, RIGHT eye   Diagnosis:   Lattice degeneration w/ atrophic holes, RIGHT eye                     Patches of lattice: 1100 superiorly; 0530-0700 inferiorly  Surgeon: Bernarda Caffey, MD, PhD  Anesthesia: Topical  Informed consent obtained, operative eye marked, and time out performed prior to initiation of laser.   Laser settings:  Lumenis [id number] laser, slit lamp Lens: [place] PRP 165 Power: 290 mW Spot size: 200 microns Duration: 30 msec  # spots: 470  Placement of laser: Using a [place] PRP 165 contact lens at the slit lamp, laser was placed in three confluent rows around patches of pigmented lattice w/ atrophic holes at 1100 and from 0530-0700 oclock anterior to equator.  Complications: None.  Patient tolerated the procedure well and received written and verbal post-procedure care information/education.            ASSESSMENT/PLAN:    ICD-10-CM   1. 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 Retinal hole of both eyes  H33.323 Repair Retinal Breaks, Laser - OD - Right Eye    3. Epiretinal membrane (ERM) of left eye  H35.372     4. Lamellar macular hole of left eye  H35.342     5. Combined forms of age-related cataract of both eyes  H25.813       1,2. Lattice degeneration w/ atrophic holes OU - OD: pigmented lattice at 1100 and 0530 to 0700, and focal hypopigmented CR atrophy at 0800 equator - OS: pigmented lattice inferiorly from 0500 to 0700 - recommend laser retinopexy OD today, 09.18.23 - pt  wishes to proceed with laser - RBA of procedure discussed, questions answered - informed consent obtained and signed - see procedure note - start PF QID OD x7 days - f/u 2-3 weeks for laser retinopexy OS  3,4. Epiretinal membrane with lamellar hole OS - mild ERM with lamellar hole and cystic changes - BCVA 20/20, asymptomatic, no metamorphopsia - no indication for surgery at this time - monitor for now  5. Mixed cataracts OU - The symptoms of cataract, surgical options, and treatments and risks were discussed with patient. - discussed diagnosis and progression - not yet visually significant - monitor for now  Ophthalmic Meds Ordered this visit:  Meds ordered this encounter  Medications   prednisoLONE acetate (PRED FORTE) 1 % ophthalmic suspension    Sig: Place 1 drop into the right eye 4 (four) times daily for 7 days.    Dispense:  10 mL    Refill:  0     Return for f/u 2-3 weeks, lattice OU, DFE, OCT.  There are no Patient Instructions on file for this visit.  Explained the diagnoses, plan, and follow up with the patient and they expressed understanding.  Patient expressed understanding of the importance of proper follow up care.   This document serves as a record of services personally performed by Gardiner Sleeper, MD, PhD. It was created on their behalf by Roselee Nova, COMT. The creation of this record is the provider's dictation and/or activities during the visit.  Electronically signed by: Roselee Nova, COMT 06/04/22 10:53 PM  This document serves as a record of services personally performed by Gardiner Sleeper, MD, PhD. It was created on  their behalf by San Jetty. Owens Shark, OA an ophthalmic technician. The creation of this record is the provider's dictation and/or activities during the visit.    Electronically signed by: San Jetty. Marguerita Merles 09.18.2023 10:53 PM  Gardiner Sleeper, M.D., Ph.D. Diseases & Surgery of the Retina and Vitreous [facility]  I have reviewed the above documentation for accuracy and completeness, and I agree with the above. Gardiner Sleeper, M.D., Ph.D. 06/04/22 10:56 PM  Abbreviations: M myopia (nearsighted); A astigmatism; H hyperopia (farsighted); P presbyopia; Mrx spectacle prescription;  CTL contact lenses; OD right eye; OS left eye; OU both eyes  XT exotropia; ET esotropia; PEK punctate epithelial keratitis; PEE punctate epithelial erosions; DES dry eye syndrome; MGD meibomian gland dysfunction; ATs artificial tears; PFAT's preservative free artificial tears; [place] nuclear sclerotic cataract; PSC posterior subcapsular cataract; ERM epi-retinal membrane; PVD posterior vitreous detachment; RD retinal detachment; DM diabetes mellitus; DR diabetic retinopathy; NPDR non-proliferative diabetic retinopathy; PDR proliferative diabetic retinopathy; CSME clinically significant macular edema; DME diabetic macular edema; dbh dot blot hemorrhages; CWS cotton wool spot; POAG primary open angle glaucoma; C/D cup-to-disc ratio; HVF humphrey visual field; GVF goldmann visual field; OCT optical coherence tomography; IOP intraocular pressure; BRVO Branch retinal vein occlusion; CRVO central retinal vein occlusion; CRAO central retinal artery occlusion; BRAO branch retinal artery occlusion; RT retinal tear; SB scleral buckle; PPV pars plana vitrectomy; VH Vitreous hemorrhage; PRP panretinal laser photocoagulation; IVK intravitreal kenalog; VMT vitreomacular traction; MH Macular hole;  NVD neovascularization of the disc; NVE neovascularization elsewhere; AREDS age related eye disease study; ARMD age related macular degeneration; POAG primary open angle glaucoma; EBMD epithelial/anterior basement membrane dystrophy; ACIOL anterior chamber intraocular lens; IOL intraocular lens; PCIOL posterior chamber intraocular lens; Phaco/IOL phacoemulsification with intraocular lens placement; [place] photorefractive keratectomy; LASIK laser assisted in situ  keratomileusis; HTN hypertension; DM diabetes mellitus; COPD chronic obstructive pulmonary disease

## 2022-06-04 ENCOUNTER — Ambulatory Visit (INDEPENDENT_AMBULATORY_CARE_PROVIDER_SITE_OTHER): Payer: BC Managed Care – PPO | Admitting: Ophthalmology

## 2022-06-04 ENCOUNTER — Encounter (INDEPENDENT_AMBULATORY_CARE_PROVIDER_SITE_OTHER): Payer: Self-pay | Admitting: Ophthalmology

## 2022-06-04 DIAGNOSIS — H35413 Lattice degeneration of retina, bilateral: Secondary | ICD-10-CM

## 2022-06-04 DIAGNOSIS — H33323 Round hole, bilateral: Secondary | ICD-10-CM

## 2022-06-04 DIAGNOSIS — H25813 Combined forms of age-related cataract, bilateral: Secondary | ICD-10-CM

## 2022-06-04 DIAGNOSIS — H35372 Puckering of macula, left eye: Secondary | ICD-10-CM

## 2022-06-04 DIAGNOSIS — H35342 Macular cyst, hole, or pseudohole, left eye: Secondary | ICD-10-CM

## 2022-06-04 MED ORDER — PREDNISOLONE ACETATE 1 % OP SUSP: 1.0000 [drp] | Freq: Four times a day (QID) | OPHTHALMIC | 0 refills | Status: AC

## 2022-06-20 ENCOUNTER — Encounter (INDEPENDENT_AMBULATORY_CARE_PROVIDER_SITE_OTHER): Payer: BC Managed Care – PPO | Admitting: Ophthalmology

## 2022-06-22 NOTE — Progress Notes (Signed)
[facility] - Clinic Note  06/27/2022     CHIEF COMPLAINT Patient presents for Retina Follow Up   HISTORY OF PRESENT ILLNESS: Tina Woodard is a 61 y.o. female who presents to the clinic today for:   HPI     Retina Follow Up   Patient presents with  Other.  In both eyes.  This started 3 weeks ago.  I, the attending physician,  performed the HPI with the patient and updated documentation appropriately.        Comments   Patient here 3 weeks retina follow up for lattice deg OU s/p retinopexy OD, retinopexy OS. Patient states vision is ok. Had some more floaters in OD after the procedure. No eye pain.      Last edited by Rennis Chris, MD on 06/27/2022  7:20 PM.     Pt here for laser retinopexy OS  Referring physician: Merri Brunette, MD [phone] [place],  [place] [address]  HISTORICAL INFORMATION:   Selected notes from the MEDICAL RECORD NUMBER Referred by Dr. Zenaida Niece for concern of retinal hole LEE:  Ocular Hx- PMH-    CURRENT MEDICATIONS: No current outpatient medications on file. (Ophthalmic Drugs)   No current facility-administered medications for this visit. (Ophthalmic Drugs)   Current Outpatient Medications (Other)  Medication Sig   cholecalciferol (VITAMIN D3) 25 MCG (1000 UNIT) tablet Take 1,000 Units by mouth daily.   MAGNESIUM PO Take 250-500 mg by mouth daily.   Multiple Vitamin (MULTIVITAMIN WITH MINERALS) TABS tablet Take 1 tablet by mouth daily.   pravastatin (PRAVACHOL) 20 MG tablet Take 20 mg by mouth daily.   acetaminophen (TYLENOL) 325 MG tablet Take 2 tablets (650 mg total) by mouth every 6 (six) hours as needed for mild pain. (Patient not taking: Reported on 05/25/2022)   oxyCODONE (OXY IR/ROXICODONE) 5 MG immediate release tablet Take 1 tablet (5 mg total) by mouth every 6 (six) hours as needed for severe pain. (Patient not taking: Reported on 05/25/2022)   No current facility-administered medications for  this visit. (Other)   REVIEW OF SYSTEMS: ROS   Positive for: Gastrointestinal, Musculoskeletal, Eyes Negative for: Constitutional, Neurological, Skin, Genitourinary, HENT, Endocrine, Cardiovascular, Respiratory, Psychiatric, Allergic/Imm, Heme/Lymph Last edited by Laddie Aquas, COA on 06/27/2022  1:30 PM.     ALLERGIES No Known Allergies  PAST MEDICAL HISTORY Past Medical History:  Diagnosis Date   Hyperlipidemia    Osteoporosis    History reviewed. No pertinent surgical history.  FAMILY HISTORY Family History  Problem Relation Age of Onset   Diabetes Sister    Glaucoma Paternal Grandmother    Diabetes Paternal Grandmother    Glaucoma Paternal Grandfather    SOCIAL HISTORY Social History   Tobacco Use   Smoking status: Never  Vaping Use   Vaping Use: Never used  Substance Use Topics   Alcohol use: Yes    Comment: social   Drug use: Never       OPHTHALMIC EXAM:  Base Eye Exam     Visual Acuity (Snellen - Linear)       Right Left   Dist cc 20/20 -2 20/25 -1   Dist ph cc  NI    Correction: Glasses         Tonometry (Tonopen, 1:27 PM)       Right Left   Pressure 12 10         Pupils       Dark Light Shape  React APD   Right 3 2 Round Brisk None   Left 3 2 Round Brisk None         Visual Fields (Counting fingers)       Left Right    Full Full         Neuro/Psych     Oriented x3: Yes   Mood/Affect: Normal         Dilation     Both eyes: 1.0% Mydriacyl, 2.5% Phenylephrine @ 1:27 PM           Slit Lamp and Fundus Exam     Slit Lamp Exam       Right Left   Lids/Lashes dermatochalasis dermatochalasis   Conjunctiva/Sclera white and quiet white and quiet, mild temporal pinguecula   Cornea mild arcus, trace PEE mild arcus, trace tear film debirs   Anterior Chamber deep and clear deep and clear   Iris round and dilated round and dilated   Lens 2+ NS, 2+ cortical 2+ NS, 2+ cortical   Anterior Vitreous mild syneresis;  PVD mild syneresis, PVD, weiss ring, vitreous condensations         Fundus Exam       Right Left   Disc pink and sharp, compact pink and sharp, compact, mild PPA   C/D Ratio 0.1 0.2   Macula flat, good foveal reflex, mild RPE mottling, no heme or edema flat, blunted foveal reflex, ERM with striae and lamellar hole   Vessels mild attenuation, mild cooper wiring, A/V crossing changes mild attenuation, mild cooper wiring   Periphery Attached, focal pre-retinal heme just above the disc -- improved, patches of pigmented lattice from [phone], and at 1100; focal hypopigmented CR atrophy at 0800 equator pigmented lattice from [phone], at 0730 and at 1200; pigmented VR tuft at 1030           Refraction     Wearing Rx       Sphere Cylinder Axis Add   Right -5.25 +2.00 098 +2.00   Left -6.75 +1.25 088 +2.00           IMAGING AND PROCEDURES  Imaging and Procedures for 06/27/2022  OCT, Retina - OU - Both Eyes       Right Eye Central Foveal Thickness: 253. Progression has been stable. Findings include normal foveal contour, no IRF, no SRF (Interval release of partial PVD, trace vitreous opacities ).   Left Eye Central Foveal Thickness: 420. Progression has been stable. Findings include no SRF, abnormal foveal contour, epiretinal membrane, intraretinal fluid, lamellar hole, macular hole, preretinal fibrosis (ERM with lamellar macular hole with IRF, pucker and PRF; inferior VR tuft caught on widefield -- not imaged today).   Notes *Images captured and stored on drive  Diagnosis / Impression:  OD: NFP, no IRF/SRF; interval release of partial PVD and trace vitreous opacities OS: ERM with lamellar macular hole with IRF, pucker and PRF--stable; inferior VR tuft caught on widefield -- not imaged today   Clinical management:  See below  Abbreviations: NFP - Normal foveal profile. CME - cystoid macular edema. PED - pigment epithelial detachment. IRF - intraretinal fluid. SRF -  subretinal fluid. EZ - ellipsoid zone. ERM - epiretinal membrane. ORA - outer retinal atrophy. ORT - outer retinal tubulation. SRHM - subretinal hyper-reflective material. IRHM - intraretinal hyper-reflective material      Repair Retinal Breaks, Laser - OS - Left Eye       LASER PROCEDURE NOTE  Procedure:  Barrier laser retinopexy using  slit lamp laser, LEFT eye   Diagnosis:   Lattice degeneration w/ atrophic holes, LEFT eye                     Patches of pigmented lattice: [phone], 0730 and 1200; pigmented VR tuft at 1030  Surgeon: Rennis Chris, MD, PhD  Anesthesia: Topical  Informed consent obtained, operative eye marked, and time out performed prior to initiation of laser.   Laser settings:  Lumenis [id number] laser, slit lamp Lens: Mainster PRP 165 Power: 270 mW Spot size: 200 microns Duration: 30 msec  # spots: 636  Placement of laser: Using a Mainster PRP 165 contact lens at the slit lamp, laser was placed in three confluent rows around patches of lattice w/ atrophic holes at [phone], 0730, and 1200 and around pigmented VR tuft at 1030 oclock anterior to equator  Complications: None.  Patient tolerated the procedure well and received written and verbal post-procedure care information/education.            ASSESSMENT/PLAN:    ICD-10-CM   1. Bilateral retinal lattice degeneration  H35.413 OCT, Retina - OU - Both Eyes    Repair Retinal Breaks, Laser - OS - Left Eye    2. Retinal hole of both eyes  H33.323 Repair Retinal Breaks, Laser - OS - Left Eye    3. Epiretinal membrane (ERM) of left eye  H35.372 OCT, Retina - OU - Both Eyes    4. Lamellar macular hole of left eye  H35.342 OCT, Retina - OU - Both Eyes    5. Combined forms of age-related cataract of both eyes  H25.813      1,2. Lattice degeneration w/ atrophic holes OU - OD: pigmented lattice at 1100 and 0530 to 0700, and focal hypopigmented CR atrophy at 0800 equator - OS: pigmented lattice [phone],  0730 and 1200; pigmented VR tuft at 1030 - s/p laser retinopexy OD (09.18.23) - recommend laser retinopexy OS today, 10.11.23 - pt wishes to proceed with laser - RBA of procedure discussed, questions answered - informed consent obtained and signed - see procedure note  - start PF QID OS x7 days - f/u 2-3 weeks for post-laser check  3,4. Epiretinal membrane with lamellar hole OS - mild ERM with lamellar hole and cystic changes - BCVA 20/20, asymptomatic, no metamorphopsia - no indication for surgery at this time - monitor for now  5. Mixed cataracts OU - The symptoms of cataract, surgical options, and treatments and risks were discussed with patient. - discussed diagnosis and progression - not yet visually significant - monitor for now  Ophthalmic Meds Ordered this visit:  No orders of the defined types were placed in this encounter.    Return for f/u 2-3 weeks, s/p laser retinopexy OU, DFE, OCT.  There are no Patient Instructions on file for this visit.  Explained the diagnoses, plan, and follow up with the patient and they expressed understanding.  Patient expressed understanding of the importance of proper follow up care.   This document serves as a record of services personally performed by Karie Chimera, MD, PhD. It was created on their behalf by De Blanch, an ophthalmic technician. The creation of this record is the provider's dictation and/or activities during the visit.    Electronically signed by: De Blanch, OA, 06/27/22  7:27 PM  This document serves as a record of services personally performed by Karie Chimera, MD, PhD. It was created on their behalf by Glee Arvin. Manson Passey,  OA an ophthalmic technician. The creation of this record is the provider's dictation and/or activities during the visit.    Electronically signed by: Glee Arvin. Manson Passey, [place] 10.11.2023 7:27 PM  Karie Chimera, M.D., Ph.D. Diseases & Surgery of the Retina and Vitreous [facility]  I have reviewed the above documentation for accuracy and completeness, and I agree with the above. Karie Chimera, M.D., Ph.D. 06/27/22 7:29 PM  Abbreviations: M myopia (nearsighted); A astigmatism; H hyperopia (farsighted); P presbyopia; Mrx spectacle prescription;  CTL contact lenses; OD right eye; OS left eye; OU both eyes  XT exotropia; ET esotropia; PEK punctate epithelial keratitis; PEE punctate epithelial erosions; DES dry eye syndrome; MGD meibomian gland dysfunction; ATs artificial tears; PFAT's preservative free artificial tears; NSC nuclear sclerotic cataract; PSC posterior subcapsular cataract; ERM epi-retinal membrane; PVD posterior vitreous detachment; RD retinal detachment; DM diabetes mellitus; DR diabetic retinopathy; NPDR non-proliferative diabetic retinopathy; PDR proliferative diabetic retinopathy; CSME clinically significant macular edema; DME diabetic macular edema; dbh dot blot hemorrhages; CWS cotton wool spot; POAG primary open angle glaucoma; C/D cup-to-disc ratio; HVF humphrey visual field; GVF goldmann visual field; OCT optical coherence tomography; IOP intraocular pressure; BRVO Branch retinal vein occlusion; CRVO central retinal vein occlusion; CRAO central retinal artery occlusion; BRAO branch retinal artery occlusion; RT retinal tear; SB scleral buckle; PPV pars plana vitrectomy; VH Vitreous hemorrhage; PRP panretinal laser photocoagulation; IVK intravitreal kenalog; VMT vitreomacular traction; MH Macular hole;  NVD neovascularization of the disc; NVE neovascularization elsewhere; AREDS age related eye disease study; ARMD age related macular degeneration; POAG primary open angle glaucoma; EBMD epithelial/anterior basement membrane dystrophy; ACIOL anterior chamber intraocular lens; IOL intraocular lens; PCIOL posterior chamber intraocular lens; Phaco/IOL phacoemulsification with intraocular lens placement; PRK photorefractive keratectomy; LASIK laser  assisted in situ keratomileusis; HTN hypertension; DM diabetes mellitus; COPD chronic obstructive pulmonary disease

## 2022-06-27 ENCOUNTER — Ambulatory Visit (INDEPENDENT_AMBULATORY_CARE_PROVIDER_SITE_OTHER): Payer: BC Managed Care – PPO | Admitting: Ophthalmology

## 2022-06-27 ENCOUNTER — Encounter (INDEPENDENT_AMBULATORY_CARE_PROVIDER_SITE_OTHER): Payer: Self-pay | Admitting: Ophthalmology

## 2022-06-27 DIAGNOSIS — H35413 Lattice degeneration of retina, bilateral: Secondary | ICD-10-CM

## 2022-06-27 DIAGNOSIS — H25813 Combined forms of age-related cataract, bilateral: Secondary | ICD-10-CM

## 2022-06-27 DIAGNOSIS — H33323 Round hole, bilateral: Secondary | ICD-10-CM

## 2022-06-27 DIAGNOSIS — H35372 Puckering of macula, left eye: Secondary | ICD-10-CM | POA: Diagnosis not present

## 2022-06-27 DIAGNOSIS — H35342 Macular cyst, hole, or pseudohole, left eye: Secondary | ICD-10-CM

## 2022-07-11 NOTE — Progress Notes (Signed)
[facility] Note  07/18/2022     CHIEF COMPLAINT Patient presents for Retina Follow Up   HISTORY OF PRESENT ILLNESS: Tina Woodard is a 61 y.o. female who presents to the clinic today for:   HPI     Retina Follow Up   Patient presents with  Other.  In both eyes.  This started months ago.  Duration of 3 weeks.  I, the attending physician,  performed the HPI with the patient and updated documentation appropriately.        Comments   Patient denies noticing any vision changes at this time. She is using OTC allergy drops OU PRN.       Last edited by Bernarda Caffey, MD on 07/22/2022 11:06 PM.    Pt states she had no problems after laser procedure at last visit, no new fol or floaters   Referring physician: Lisabeth Pick, MD [place],  [place] [address]  HISTORICAL INFORMATION:   Selected notes from the MEDICAL RECORD NUMBER Referred by Dr. Lucianne Lei for concern of retinal hole LEE:  Ocular Hx- PMH-    CURRENT MEDICATIONS: No current outpatient medications on file. (Ophthalmic Drugs)   No current facility-administered medications for this visit. (Ophthalmic Drugs)   Current Outpatient Medications (Other)  Medication Sig   acetaminophen (TYLENOL) 325 MG tablet Take 2 tablets (650 mg total) by mouth every 6 (six) hours as needed for mild pain. (Patient not taking: Reported on 05/25/2022)   cholecalciferol (VITAMIN D3) 25 MCG (1000 UNIT) tablet Take 1,000 Units by mouth daily.   MAGNESIUM PO Take 250-500 mg by mouth daily.   Multiple Vitamin (MULTIVITAMIN WITH MINERALS) TABS tablet Take 1 tablet by mouth daily.   oxyCODONE (OXY IR/ROXICODONE) 5 MG immediate release tablet Take 1 tablet (5 mg total) by mouth every 6 (six) hours as needed for severe pain. (Patient not taking: Reported on 05/25/2022)   pravastatin (PRAVACHOL) 20 MG tablet Take 20 mg by mouth daily.   No current facility-administered medications for this visit. (Other)    REVIEW OF SYSTEMS: ROS   Positive for: Gastrointestinal, Musculoskeletal, Eyes Negative for: Constitutional, Neurological, Skin, Genitourinary, HENT, Endocrine, Cardiovascular, Respiratory, Psychiatric, Allergic/Imm, Heme/Lymph Last edited by Annie Paras, COT on 07/18/2022  1:43 PM.      ALLERGIES No Known Allergies  PAST MEDICAL HISTORY Past Medical History:  Diagnosis Date   Hyperlipidemia    Osteoporosis    History reviewed. No pertinent surgical history.  FAMILY HISTORY Family History  Problem Relation Age of Onset   Diabetes Sister    Glaucoma Paternal Grandmother    Diabetes Paternal Grandmother    Glaucoma Paternal Grandfather    SOCIAL HISTORY Social History   Tobacco Use   Smoking status: Never  Vaping Use   Vaping Use: Never used  Substance Use Topics   Alcohol use: Yes    Comment: social   Drug use: Never       OPHTHALMIC EXAM:  Base Eye Exam     Visual Acuity (Snellen - Linear)       Right Left   Dist cc 20/20 +2 20/25    Correction: Glasses         Tonometry (Tonopen, 1:45 PM)       Right Left   Pressure 14 12         Pupils       Dark Light Shape React APD   Right 3 2 Round Brisk None  Left 3 2 Round Brisk None         Visual Fields       Left Right    Full Full         Extraocular Movement       Right Left    Full, Ortho Full, Ortho         Neuro/Psych     Oriented x3: Yes   Mood/Affect: Normal         Dilation     Both eyes: 1.0% Mydriacyl, 2.5% Phenylephrine @ 1:43 PM           Slit Lamp and Fundus Exam     Slit Lamp Exam       Right Left   Lids/Lashes dermatochalasis dermatochalasis   Conjunctiva/Sclera white and quiet white and quiet, mild temporal pinguecula   Cornea mild arcus, trace PEE mild arcus, trace tear film debirs   Anterior Chamber deep and clear deep and clear   Iris round and dilated round and dilated   Lens 2+ NS, 2+ cortical 2+ NS, 2+ cortical   Anterior  Vitreous mild syneresis; PVD mild syneresis, PVD, weiss ring, vitreous condensations         Fundus Exam       Right Left   Disc pink and sharp, compact pink and sharp, compact, mild PPA   C/D Ratio 0.1 0.3   Macula flat, good foveal reflex, mild RPE mottling, no heme or edema flat, blunted foveal reflex, ERM with striae and lamellar hole   Vessels mild attenuation, mild cooper wiring, A/V crossing changes mild attenuation, mild cooper wiring   Periphery Attached, focal pre-retinal heme just above the disc -- improved, patches of pigmented lattice from [phone], and at 1100; focal hypopigmented CR atrophy at 0800 equator, good laser surrounding all lesions, no new RT/RD/lattice pigmented lattice from [phone], at 0730 and at 1200; pigmented VR tuft at 1030, good laser changes surrounding all lesions, no new RT/RD/lattice           Refraction     Wearing Rx       Sphere Cylinder Axis Add   Right -5.25 +2.00 098 +2.00   Left -6.75 +1.25 088 +2.00           IMAGING AND PROCEDURES  Imaging and Procedures for 07/18/2022  OCT, Retina - OU - Both Eyes       Right Eye Quality was good. Central Foveal Thickness: 260. Progression has been stable. Findings include normal foveal contour, no IRF, no SRF (stable release of partial PVD, trace vitreous opacities ).   Left Eye Quality was good. Central Foveal Thickness: 413. Progression has been stable. Findings include no SRF, abnormal foveal contour, epiretinal membrane, intraretinal fluid, lamellar hole, macular hole, preretinal fibrosis (ERM with lamellar macular hole, +IRF, +pucker and PRF; inferior VR tuft caught on widefield -- not imaged today).   Notes *Images captured and stored on drive  Diagnosis / Impression:  OD: NFP, no IRF/SRF; stable release of partial PVD and trace vitreous opacities OS: ERM with lamellar macular hole with IRF, pucker and PRF--stable; inferior VR tuft caught on widefield -- not imaged  today   Clinical management:  See below  Abbreviations: NFP - Normal foveal profile. CME - cystoid macular edema. PED - pigment epithelial detachment. IRF - intraretinal fluid. SRF - subretinal fluid. EZ - ellipsoid zone. ERM - epiretinal membrane. ORA - outer retinal atrophy. ORT - outer retinal tubulation. SRHM - subretinal hyper-reflective material. IRHM -  intraretinal hyper-reflective material            ASSESSMENT/PLAN:    ICD-10-CM   1. Bilateral retinal lattice degeneration  H35.413     2. Retinal hole of both eyes  H33.323     3. Epiretinal membrane (ERM) of left eye  H35.372 OCT, Retina - OU - Both Eyes    4. Lamellar macular hole of left eye  H35.342     5. Combined forms of age-related cataract of both eyes  H25.813       1,2. Lattice degeneration w/ atrophic holes OU - OD: pigmented lattice at 1100 and 0530 to 0700, and focal hypopigmented CR atrophy at 0800 equator - OS: pigmented lattice [phone], 0730 and 1200; pigmented VR tuft at 1030 - s/p laser retinopexy OD (09.18.23) -- good laser surrounding all lesions - s/p laser retinopexy OS (10.11.23) -- good laser surrounding all lesions - no new RT/RD or lattice - f/u 3-4 months   3,4. Epiretinal membrane with lamellar hole OS - mild ERM with lamellar hole and cystic changes - BCVA 20/25, asymptomatic, no metamorphopsia - no indication for surgery at this time - monitor for now - f/u 3-4 mos  5. Mixed cataracts OU - The symptoms of cataract, surgical options, and treatments and risks were discussed with patient. - discussed diagnosis and progression - not yet visually significant - monitor for now  Ophthalmic Meds Ordered this visit:  No orders of the defined types were placed in this encounter.    Return for f/u 3-4 months, lattice degeneration OU, DFE, OCT.  There are no Patient Instructions on file for this visit.  Explained the diagnoses, plan, and follow up with the patient and they expressed  understanding.  Patient expressed understanding of the importance of proper follow up care.   This document serves as a record of services personally performed by Karie Chimera, MD, PhD. It was created on their behalf by De Blanch, an ophthalmic technician. The creation of this record is the provider's dictation and/or activities during the visit.    Electronically signed by: De Blanch, OA, 07/22/22  11:10 PM  This document serves as a record of services personally performed by Karie Chimera, MD, PhD. It was created on their behalf by Glee Arvin. Manson Passey, OA an ophthalmic technician. The creation of this record is the provider's dictation and/or activities during the visit.    Electronically signed by: Glee Arvin. [place], [place] 11.01.2023 11:10 PM   Karie Chimera, M.D., Ph.D. Diseases & Surgery of the Retina and Vitreous [facility]  I have reviewed the above documentation for accuracy and completeness, and I agree with the above. Karie Chimera, M.D., Ph.D. 07/22/22 11:11 PM  Abbreviations: M myopia (nearsighted); A astigmatism; H hyperopia (farsighted); P presbyopia; Mrx spectacle prescription;  CTL contact lenses; OD right eye; OS left eye; OU both eyes  XT exotropia; ET esotropia; PEK punctate epithelial keratitis; PEE punctate epithelial erosions; DES dry eye syndrome; MGD meibomian gland dysfunction; ATs artificial tears; PFAT's preservative free artificial tears; NSC nuclear sclerotic cataract; PSC posterior subcapsular cataract; ERM epi-retinal membrane; PVD posterior vitreous detachment; RD retinal detachment; DM diabetes mellitus; DR diabetic retinopathy; NPDR non-proliferative diabetic retinopathy; PDR proliferative diabetic retinopathy; CSME clinically significant macular edema; DME diabetic macular edema; dbh dot blot hemorrhages; CWS cotton wool spot; POAG primary open angle glaucoma; C/D cup-to-disc ratio; HVF humphrey visual field; GVF goldmann  visual field; OCT optical coherence tomography; IOP intraocular pressure;  BRVO Branch retinal vein occlusion; CRVO central retinal vein occlusion; CRAO central retinal artery occlusion; BRAO branch retinal artery occlusion; RT retinal tear; SB scleral buckle; PPV pars plana vitrectomy; VH Vitreous hemorrhage; PRP panretinal laser photocoagulation; IVK intravitreal kenalog; VMT vitreomacular traction; MH Macular hole;  NVD neovascularization of the disc; NVE neovascularization elsewhere; AREDS age related eye disease study; ARMD age related macular degeneration; POAG primary open angle glaucoma; EBMD epithelial/anterior basement membrane dystrophy; ACIOL anterior chamber intraocular lens; IOL intraocular lens; PCIOL posterior chamber intraocular lens; Phaco/IOL phacoemulsification with intraocular lens placement; Faxon photorefractive keratectomy; LASIK laser assisted in situ keratomileusis; HTN hypertension; DM diabetes mellitus; COPD chronic obstructive pulmonary disease

## 2022-07-18 ENCOUNTER — Ambulatory Visit (INDEPENDENT_AMBULATORY_CARE_PROVIDER_SITE_OTHER): Payer: BC Managed Care – PPO | Admitting: Ophthalmology

## 2022-07-18 DIAGNOSIS — H35372 Puckering of macula, left eye: Secondary | ICD-10-CM

## 2022-07-18 DIAGNOSIS — H25813 Combined forms of age-related cataract, bilateral: Secondary | ICD-10-CM | POA: Diagnosis not present

## 2022-07-18 DIAGNOSIS — H33323 Round hole, bilateral: Secondary | ICD-10-CM

## 2022-07-18 DIAGNOSIS — H35342 Macular cyst, hole, or pseudohole, left eye: Secondary | ICD-10-CM

## 2022-07-18 DIAGNOSIS — H35413 Lattice degeneration of retina, bilateral: Secondary | ICD-10-CM

## 2022-07-22 ENCOUNTER — Encounter (INDEPENDENT_AMBULATORY_CARE_PROVIDER_SITE_OTHER): Payer: Self-pay | Admitting: Ophthalmology

## 2022-11-02 NOTE — Progress Notes (Signed)
[facility] Note  11/07/2022     CHIEF COMPLAINT Patient presents for Retina Follow Up   HISTORY OF PRESENT ILLNESS: Tina Woodard is a 62 y.o. female who presents to the clinic today for:   HPI     Retina Follow Up   Patient presents with  Other.  In both eyes.  This started months ago.  Duration of 3 weeks.  I, the attending physician,  performed the HPI with the patient and updated documentation appropriately.        Comments   Patient feels that the vision is the same. She is not using any eye drops at this time.       Last edited by Bernarda Caffey, MD on 11/08/2022  2:05 PM.    Pt states as far as she can tell, her vision is fine, she has a new glasses rx from her optometrist   Referring physician: Carol Ada, [place],  [place] [address]  HISTORICAL INFORMATION:   Selected notes from the MEDICAL RECORD NUMBER Referred by Dr. Lucianne Lei for concern of retinal hole LEE:  Ocular Hx- PMH-    CURRENT MEDICATIONS: No current outpatient medications on file. (Ophthalmic Drugs)   No current facility-administered medications for this visit. (Ophthalmic Drugs)   Current Outpatient Medications (Other)  Medication Sig   acetaminophen (TYLENOL) 325 MG tablet Take 2 tablets (650 mg total) by mouth every 6 (six) hours as needed for mild pain. (Patient not taking: Reported on 05/25/2022)   cholecalciferol (VITAMIN D3) 25 MCG (1000 UNIT) tablet Take 1,000 Units by mouth daily.   MAGNESIUM PO Take 250-500 mg by mouth daily.   Multiple Vitamin (MULTIVITAMIN WITH MINERALS) TABS tablet Take 1 tablet by mouth daily.   oxyCODONE (OXY IR/ROXICODONE) 5 MG immediate release tablet Take 1 tablet (5 mg total) by mouth every 6 (six) hours as needed for severe pain. (Patient not taking: Reported on 05/25/2022)   pravastatin (PRAVACHOL) 20 MG tablet Take 20 mg by mouth daily.   No current facility-administered medications for this  visit. (Other)   REVIEW OF SYSTEMS: ROS   Positive for: Gastrointestinal, Musculoskeletal, Eyes Negative for: Constitutional, Neurological, Skin, Genitourinary, HENT, Endocrine, Cardiovascular, Respiratory, Psychiatric, Allergic/Imm, Heme/Lymph Last edited by Annie Paras, COT on 11/07/2022  1:57 PM.     ALLERGIES No Known Allergies  PAST MEDICAL HISTORY Past Medical History:  Diagnosis Date   Hyperlipidemia    Osteoporosis    History reviewed. No pertinent surgical history.  FAMILY HISTORY Family History  Problem Relation Age of Onset   Diabetes Sister    Glaucoma Paternal Grandmother    Diabetes Paternal Grandmother    Glaucoma Paternal Grandfather    SOCIAL HISTORY Social History   Tobacco Use   Smoking status: Never  Vaping Use   Vaping Use: Never used  Substance Use Topics   Alcohol use: Yes    Comment: social   Drug use: Never       OPHTHALMIC EXAM:  Base Eye Exam     Visual Acuity (Snellen - Linear)       Right Left   Dist cc 20/20 20/20    Correction: Glasses         Tonometry (Tonopen, 2:00 PM)       Right Left   Pressure 12 12         Pupils       Dark Light Shape React APD  Right 3 2 Round Brisk None   Left 3 2 Round Brisk None         Visual Fields       Left Right    Full Full         Extraocular Movement       Right Left    Full, Ortho Full, Ortho         Neuro/Psych     Oriented x3: Yes   Mood/Affect: Normal         Dilation     Both eyes: 1.0% Mydriacyl, 2.5% Phenylephrine @ 1:57 PM           Slit Lamp and Fundus Exam     Slit Lamp Exam       Right Left   Lids/Lashes mild MGD dermatochalasis   Conjunctiva/Sclera white and quiet white and quiet, mild temporal pinguecula   Cornea mild arcus, trace PEE mild arcus, trace tear film debirs   Anterior Chamber deep and clear deep and clear   Iris round and dilated round and dilated   Lens 2+ NS, 2+ cortical 2+ NS, 2+ cortical    Anterior Vitreous mild syneresis; PVD mild syneresis, PVD, weiss ring, vitreous condensations         Fundus Exam       Right Left   Disc pink and sharp, compact pink and sharp, compact, mild PPA   C/D Ratio 0.1 0.2   Macula flat, good foveal reflex, mild RPE mottling, no heme or edema, mild ERM flat, blunted foveal reflex, ERM with striae and lamellar hole   Vessels mild attenuation, mild cooper wiring, A/V crossing changes mild attenuation, mild tortuosity   Periphery Attached, patches of pigmented lattice from 0500-0730, and at 1100; focal hypopigmented CR atrophy at 0800 equator, good laser surrounding all lesions, no new RT/RD/lattice Attached, pigmented lattice from [phone] and at 1200; pigmented VR tuft at 1030, good laser changes surrounding all lesions, no new RT/RD/lattice           Refraction     Wearing Rx       Sphere Cylinder Axis Add   Right -5.25 +2.00 098 +2.00   Left -6.75 +1.25 088 +2.00           IMAGING AND PROCEDURES  Imaging and Procedures for 11/07/2022  OCT, Retina - OU - Both Eyes       Right Eye Quality was good. Central Foveal Thickness: 346. Progression has worsened. Findings include no IRF, no SRF, abnormal foveal contour, epiretinal membrane (Interval development of ERM with blunting of foveal contour ).   Left Eye Quality was good. Central Foveal Thickness: 414. Progression has been stable. Findings include no SRF, abnormal foveal contour, epiretinal membrane, intraretinal fluid, lamellar hole, macular hole, preretinal fibrosis (Stable ERM with lamellar macular hole, +IRF, +pucker and PRF; inferior VR tuft caught on widefield -- not imaged today).   Notes *Images captured and stored on drive  Diagnosis / Impression:  OD: Interval development of ERM with blunting of foveal contour; no IRF/SRF OS: stable ERM with lamellar macular hole with IRF, pucker and PRF--stable; inferior VR tuft caught on widefield -- not imaged today   Clinical  management:  See below  Abbreviations: NFP - Normal foveal profile. CME - cystoid macular edema. PED - pigment epithelial detachment. IRF - intraretinal fluid. SRF - subretinal fluid. EZ - ellipsoid zone. ERM - epiretinal membrane. ORA - outer retinal atrophy. ORT - outer retinal tubulation. SRHM - subretinal hyper-reflective  material. IRHM - intraretinal hyper-reflective material            ASSESSMENT/PLAN:    ICD-10-CM   1. Epiretinal membrane (ERM) of both eyes  H35.373 OCT, Retina - OU - Both Eyes    2. 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 Retinal hole of both eyes  H33.323     5. Combined forms of age-related cataract of both eyes  H25.813      1,2. Epiretinal membrane OU  - OD: Interval development of ERM with blunting of foveal contour - OS: mild ERM with lamellar hole and cystic changes - BCVA 20/20 OU, asymptomatic, no metamorphopsia - The natural history, anatomy, potential for loss of vision, and treatment options including vitrectomy techniques and the complications of endophthalmitis, retinal detachment, vitreous hemorrhage, cataract progression and permanent vision loss discussed with the patient. - no indication for surgery at this time - monitor for now - f/u 3-4 mos - DFE/OCT  3,4. Lattice degeneration w/ atrophic holes OU - OD: pigmented lattice at 1100 and 0530 to 0700, and focal hypopigmented CR atrophy at 0800 equator - OS: pigmented lattice [phone], 0730 and 1200; pigmented VR tuft at 1030 - s/p laser retinopexy OD (09.18.23) -- good laser surrounding all lesions - s/p laser retinopexy OS (10.11.23) -- good laser surrounding all lesions - no new RT/RD or lattice - monitor  5. Mixed cataracts OU - The symptoms of cataract, surgical options, and treatments and risks were discussed with patient. - discussed diagnosis and progression - not yet visually significant - monitor for  now  Ophthalmic Meds Ordered this visit:  No orders of the defined types were placed in this encounter.    Return for f/u 3-4 months, ERM OU, DFE, OCT.  There are no Patient Instructions on file for this visit.  Explained the diagnoses, plan, and follow up with the patient and they expressed understanding.  Patient expressed understanding of the importance of proper follow up care.   This document serves as a record of services personally performed by Gardiner Sleeper, MD, PhD. It was created on their behalf by Orvan Falconer, an ophthalmic technician. The creation of this record is the provider's dictation and/or activities during the visit.    Electronically signed by: Orvan Falconer, OA, 11/08/22  2:07 PM  This document serves as a record of services personally performed by Gardiner Sleeper, MD, PhD. It was created on their behalf by San Jetty. Owens Shark, OA an ophthalmic technician. The creation of this record is the provider's dictation and/or activities during the visit.    Electronically signed by: San Jetty. Owens Shark, [place] 02.21.2024 2:07 PM  Gardiner Sleeper, M.D., Ph.D. Diseases & Surgery of the Retina and Vitreous [facility]  I have reviewed the above documentation for accuracy and completeness, and I agree with the above. Gardiner Sleeper, M.D., Ph.D. 11/08/22 2:09 PM  Abbreviations: M myopia (nearsighted); A astigmatism; H hyperopia (farsighted); P presbyopia; Mrx spectacle prescription;  CTL contact lenses; OD right eye; OS left eye; OU both eyes  XT exotropia; ET esotropia; PEK punctate epithelial keratitis; PEE punctate epithelial erosions; DES dry eye syndrome; MGD meibomian gland dysfunction; ATs artificial tears; PFAT's preservative free artificial tears; [place] nuclear sclerotic cataract; PSC posterior subcapsular cataract; ERM epi-retinal membrane; PVD posterior vitreous detachment; RD retinal detachment; DM diabetes mellitus; DR diabetic retinopathy; NPDR  non-proliferative diabetic  retinopathy; PDR proliferative diabetic retinopathy; CSME clinically significant macular edema; DME diabetic macular edema; dbh dot blot hemorrhages; CWS cotton wool spot; POAG primary open angle glaucoma; C/D cup-to-disc ratio; HVF humphrey visual field; GVF goldmann visual field; OCT optical coherence tomography; IOP intraocular pressure; BRVO Branch retinal vein occlusion; CRVO central retinal vein occlusion; CRAO central retinal artery occlusion; BRAO branch retinal artery occlusion; RT retinal tear; SB scleral buckle; PPV pars plana vitrectomy; VH Vitreous hemorrhage; PRP panretinal laser photocoagulation; IVK intravitreal kenalog; VMT vitreomacular traction; MH Macular hole;  NVD neovascularization of the disc; NVE neovascularization elsewhere; AREDS age related eye disease study; ARMD age related macular degeneration; POAG primary open angle glaucoma; EBMD epithelial/anterior basement membrane dystrophy; ACIOL anterior chamber intraocular lens; IOL intraocular lens; PCIOL posterior chamber intraocular lens; Phaco/IOL phacoemulsification with intraocular lens placement; [place] photorefractive keratectomy; LASIK laser assisted in situ keratomileusis; HTN hypertension; DM diabetes mellitus; COPD chronic obstructive pulmonary disease

## 2022-11-07 ENCOUNTER — Ambulatory Visit (INDEPENDENT_AMBULATORY_CARE_PROVIDER_SITE_OTHER): Payer: BC Managed Care – PPO | Admitting: Ophthalmology

## 2022-11-07 DIAGNOSIS — H35373 Puckering of macula, bilateral: Secondary | ICD-10-CM | POA: Diagnosis not present

## 2022-11-07 DIAGNOSIS — H33323 Round hole, bilateral: Secondary | ICD-10-CM

## 2022-11-07 DIAGNOSIS — H35372 Puckering of macula, left eye: Secondary | ICD-10-CM

## 2022-11-07 DIAGNOSIS — H35342 Macular cyst, hole, or pseudohole, left eye: Secondary | ICD-10-CM | POA: Diagnosis not present

## 2022-11-07 DIAGNOSIS — H25813 Combined forms of age-related cataract, bilateral: Secondary | ICD-10-CM

## 2022-11-07 DIAGNOSIS — H35413 Lattice degeneration of retina, bilateral: Secondary | ICD-10-CM

## 2022-11-08 ENCOUNTER — Encounter (INDEPENDENT_AMBULATORY_CARE_PROVIDER_SITE_OTHER): Payer: Self-pay | Admitting: Ophthalmology

## 2023-02-27 NOTE — Progress Notes (Signed)
[facility] - Clinic Note  03/06/2023     CHIEF COMPLAINT Patient presents for Retina Follow Up   HISTORY OF PRESENT ILLNESS: Tina Woodard is a 62 y.o. female who presents to the clinic today for:   HPI     Retina Follow Up   Patient presents with  Other.  In both eyes.  This started 4 months ago.  I, the attending physician,  performed the HPI with the patient and updated documentation appropriately.        Comments   Patient here for 4 months retina follow up for ERM OU. Patient states vision is alright. Having more floaters. No eye pain.       Last edited by Rennis Chris, MD on 03/06/2023  5:17 PM.    Pt states vision is okay, may have a few more floaters, but no distortion  Referring physician: Merri Brunette, MD [phone] [address] [place],  [place] [address]  HISTORICAL INFORMATION:   Selected notes from the MEDICAL RECORD NUMBER Referred by Dr. Zenaida Niece for concern of retinal hole LEE:  Ocular Hx- PMH-    CURRENT MEDICATIONS: No current outpatient medications on file. (Ophthalmic Drugs)   No current facility-administered medications for this visit. (Ophthalmic Drugs)   Current Outpatient Medications (Other)  Medication Sig   cholecalciferol (VITAMIN D3) 25 MCG (1000 UNIT) tablet Take 1,000 Units by mouth daily.   MAGNESIUM PO Take 250-500 mg by mouth daily.   Multiple Vitamin (MULTIVITAMIN WITH MINERALS) TABS tablet Take 1 tablet by mouth daily.   pravastatin (PRAVACHOL) 20 MG tablet Take 20 mg by mouth daily.   acetaminophen (TYLENOL) 325 MG tablet Take 2 tablets (650 mg total) by mouth every 6 (six) hours as needed for mild pain. (Patient not taking: Reported on 05/25/2022)   oxyCODONE (OXY IR/ROXICODONE) 5 MG immediate release tablet Take 1 tablet (5 mg total) by mouth every 6 (six) hours as needed for severe pain. (Patient not taking: Reported on 05/25/2022)   No current facility-administered medications for this visit. (Other)    REVIEW OF SYSTEMS: ROS   Positive for: Gastrointestinal, Musculoskeletal, Eyes Negative for: Constitutional, Neurological, Skin, Genitourinary, HENT, Endocrine, Cardiovascular, Respiratory, Psychiatric, Allergic/Imm, Heme/Lymph Last edited by Laddie Aquas, COA on 03/06/2023  1:58 PM.      ALLERGIES No Known Allergies  PAST MEDICAL HISTORY Past Medical History:  Diagnosis Date   Hyperlipidemia    Osteoporosis    History reviewed. No pertinent surgical history.  FAMILY HISTORY Family History  Problem Relation Age of Onset   Diabetes Sister    Glaucoma Paternal Grandmother    Diabetes Paternal Grandmother    Glaucoma Paternal Grandfather    SOCIAL HISTORY Social History   Tobacco Use   Smoking status: Never  Vaping Use   Vaping Use: Never used  Substance Use Topics   Alcohol use: Yes    Comment: social   Drug use: Never       OPHTHALMIC EXAM:  Base Eye Exam     Visual Acuity (Snellen - Linear)       Right Left   Dist cc 20/20 20/20    Correction: Glasses         Tonometry (Tonopen, 1:56 PM)       Right Left   Pressure 15 13         Pupils       Dark Light Shape React APD   Right 3 2 Round Brisk None  Left 3 2 Round Brisk None         Visual Fields (Counting fingers)       Left Right    Full Full         Extraocular Movement       Right Left    Full, Ortho Full, Ortho         Neuro/Psych     Oriented x3: Yes   Mood/Affect: Normal         Dilation     Both eyes: 1.0% Mydriacyl, 2.5% Phenylephrine @ 1:56 PM           Slit Lamp and Fundus Exam     Slit Lamp Exam       Right Left   Lids/Lashes mild MGD dermatochalasis   Conjunctiva/Sclera white and quiet white and quiet, mild temporal pinguecula   Cornea mild arcus, trace PEE mild arcus, trace tear film debris   Anterior Chamber deep and clear deep and clear   Iris round and dilated round and dilated   Lens 2+ NS, 2+ cortical 2+ NS, 2+ cortical    Anterior Vitreous mild syneresis; PVD mild syneresis, PVD, weiss ring, vitreous condensations         Fundus Exam       Right Left   Disc pink and sharp, compact pink and sharp, compact, mild PPA   C/D Ratio 0.1 0.2   Macula flat, blunted foveal reflex, mild RPE mottling, no heme or edema, mild ERM flat, blunted foveal reflex, ERM with striae and lamellar hole   Vessels mild cooper wiring, A/V crossing changes, attenuated, mild tortuosity, mild AV crossing changes attenuated, mild tortuosity   Periphery Attached, patches of pigmented lattice from 0500-0730 and at 1100; focal hypopigmented CR atrophy at 0800 equator, good laser surrounding all lesions, no new RT/RD/lattice Attached, pigmented lattice from 0530-0730 and at 1200; pigmented VR tuft at 1030, good laser changes surrounding all lesions, no new RT/RD/lattice           Refraction     Wearing Rx       Sphere Cylinder Axis Add   Right -5.25 +2.00 098 +2.00   Left -6.75 +1.25 088 +2.00           IMAGING AND PROCEDURES  Imaging and Procedures for 03/06/2023  OCT, Retina - OU - Both Eyes       Right Eye Quality was good. Central Foveal Thickness: 392. Progression has been stable. Findings include no IRF, no SRF, abnormal foveal contour, epiretinal membrane (ERM with blunting of foveal contour, mild progression of central thickening ).   Left Eye Quality was good. Central Foveal Thickness: 401. Progression has been stable. Findings include no SRF, abnormal foveal contour, epiretinal membrane, intraretinal fluid, lamellar hole, preretinal fibrosis (Stable ERM with lamellar macular hole, +IRF, +pucker and PRF; inferior VR tuft caught on widefield -- not imaged today).   Notes *Images captured and stored on drive  Diagnosis / Impression:  OD: ERM with blunting of foveal contour -- mild progression of central thickening OS: stable ERM with lamellar macular hole with IRF, pucker and PRF--stable; inferior VR tuft caught on  widefield -- not imaged today   Clinical management:  See below  Abbreviations: NFP - Normal foveal profile. CME - cystoid macular edema. PED - pigment epithelial detachment. IRF - intraretinal fluid. SRF - subretinal fluid. EZ - ellipsoid zone. ERM - epiretinal membrane. ORA - outer retinal atrophy. ORT - outer retinal tubulation. SRHM - subretinal hyper-reflective  material. IRHM - intraretinal hyper-reflective material            ASSESSMENT/PLAN:    ICD-10-CM   1. Epiretinal membrane (ERM) of both eyes  H35.373 OCT, Retina - OU - Both Eyes    2. 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 Retinal hole of both eyes  H33.323     5. Combined forms of age-related cataract of both eyes  H25.813       1,2. Epiretinal membrane OU  - OD: ERM with blunting of foveal contour -- mild progression of central thickening  - OS: mild ERM with lamellar hole and cystic changes -- stable - BCVA 20/20 OU, asymptomatic, no metamorphopsia - no indication for surgery at this time - monitor for now - f/u 4-6 mos - DFE/OCT  3,4. Lattice degeneration w/ atrophic holes OU - OD: pigmented lattice at 1100 and 0530 to 0700, and focal hypopigmented CR atrophy at 0800 equator - OS: pigmented lattice [phone], 0730 and 1200; pigmented VR tuft at 1030 - s/p laser retinopexy OD (09.18.23) -- good laser surrounding all lesions - s/p laser retinopexy OS (10.11.23) -- good laser surrounding all lesions - no new RT/RD or lattice - monitor  5. Mixed cataracts OU - The symptoms of cataract, surgical options, and treatments and risks were discussed with patient. - discussed diagnosis and progression - not yet visually significant - monitor for now  Ophthalmic Meds Ordered this visit:  No orders of the defined types were placed in this encounter.    Return for f/u 4-6 mos, ERM OU, DFE, OCT.  There are no Patient Instructions on file for  this visit.  Explained the diagnoses, plan, and follow up with the patient and they expressed understanding.  Patient expressed understanding of the importance of proper follow up care.   This document serves as a record of services personally performed by Karie Chimera, MD, PhD. It was created on their behalf by De Blanch, an ophthalmic technician. The creation of this record is the provider's dictation and/or activities during the visit.    Electronically signed by: De Blanch, OA, 03/06/23  5:19 PM  This document serves as a record of services personally performed by Karie Chimera, MD, PhD. It was created on their behalf by Glee Arvin. Manson Passey, OA an ophthalmic technician. The creation of this record is the provider's dictation and/or activities during the visit.    Electronically signed by: Glee Arvin. Kristopher Oppenheim 06.19.2024 5:19 PM  Karie Chimera, M.D., Ph.D. Diseases & Surgery of the Retina and Vitreous [facility]  I have reviewed the above documentation for accuracy and completeness, and I agree with the above. Karie Chimera, M.D., Ph.D. 03/06/23 5:19 PM  Abbreviations: M myopia (nearsighted); A astigmatism; H hyperopia (farsighted); P presbyopia; Mrx spectacle prescription;  CTL contact lenses; OD right eye; OS left eye; OU both eyes  XT exotropia; ET esotropia; PEK punctate epithelial keratitis; PEE punctate epithelial erosions; DES dry eye syndrome; MGD meibomian gland dysfunction; ATs artificial tears; PFAT's preservative free artificial tears; NSC nuclear sclerotic cataract; PSC posterior subcapsular cataract; ERM epi-retinal membrane; PVD posterior vitreous detachment; RD retinal detachment; DM diabetes mellitus; DR diabetic retinopathy; NPDR non-proliferative diabetic retinopathy; PDR proliferative diabetic retinopathy; CSME clinically significant macular edema; DME diabetic macular edema; dbh dot blot hemorrhages; CWS cotton wool spot; POAG  primary open angle glaucoma; C/D  cup-to-disc ratio; HVF humphrey visual field; GVF goldmann visual field; OCT optical coherence tomography; IOP intraocular pressure; BRVO Branch retinal vein occlusion; CRVO central retinal vein occlusion; CRAO central retinal artery occlusion; BRAO branch retinal artery occlusion; RT retinal tear; SB scleral buckle; PPV pars plana vitrectomy; VH Vitreous hemorrhage; PRP panretinal laser photocoagulation; IVK intravitreal kenalog; VMT vitreomacular traction; MH Macular hole;  NVD neovascularization of the disc; NVE neovascularization elsewhere; AREDS age related eye disease study; ARMD age related macular degeneration; POAG primary open angle glaucoma; EBMD epithelial/anterior basement membrane dystrophy; ACIOL anterior chamber intraocular lens; IOL intraocular lens; PCIOL posterior chamber intraocular lens; Phaco/IOL phacoemulsification with intraocular lens placement; [place] photorefractive keratectomy; LASIK laser assisted in situ keratomileusis; HTN hypertension; DM diabetes mellitus; COPD chronic obstructive pulmonary disease

## 2023-03-06 ENCOUNTER — Ambulatory Visit (INDEPENDENT_AMBULATORY_CARE_PROVIDER_SITE_OTHER): Payer: BC Managed Care – PPO | Admitting: Ophthalmology

## 2023-03-06 ENCOUNTER — Encounter (INDEPENDENT_AMBULATORY_CARE_PROVIDER_SITE_OTHER): Payer: Self-pay | Admitting: Ophthalmology

## 2023-03-06 DIAGNOSIS — H35373 Puckering of macula, bilateral: Secondary | ICD-10-CM

## 2023-03-06 DIAGNOSIS — H33323 Round hole, bilateral: Secondary | ICD-10-CM

## 2023-03-06 DIAGNOSIS — H35342 Macular cyst, hole, or pseudohole, left eye: Secondary | ICD-10-CM | POA: Diagnosis not present

## 2023-03-06 DIAGNOSIS — H35413 Lattice degeneration of retina, bilateral: Secondary | ICD-10-CM

## 2023-03-06 DIAGNOSIS — H25813 Combined forms of age-related cataract, bilateral: Secondary | ICD-10-CM | POA: Diagnosis not present

## 2023-05-08 ENCOUNTER — Other Ambulatory Visit: Payer: Self-pay | Admitting: Obstetrics and Gynecology

## 2023-05-08 DIAGNOSIS — Z803 Family history of malignant neoplasm of breast: Secondary | ICD-10-CM
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
## 2023-07-23 NOTE — Progress Notes (Signed)
[facility] - Clinic Note  08/06/2023     CHIEF COMPLAINT Patient presents for Retina Follow Up   HISTORY OF PRESENT ILLNESS: Tina Woodard is a 62 y.o. female who presents to the clinic today for:   HPI     Retina Follow Up   Patient presents with  Other.  In both eyes.  Severity is moderate.  Duration of 5 months.  Since onset it is stable.  I, the attending physician,  performed the HPI with the patient and updated documentation appropriately.        Comments   5 month Retina eval. Patient states no vision changes noticed. Patient recently went to [facility] and no change in glasses      Last edited by Rennis Chris, MD on 08/06/2023 10:01 PM.    Patient states the floaters are continuing and are annoying. She is not noticing any increased distortion in vision in the right eye.  Referring physician: Merri Brunette, MD [phone] [place],  [place] [address]  HISTORICAL INFORMATION:   Selected notes from the MEDICAL RECORD NUMBER Referred by Dr. Zenaida Niece for concern of retinal hole LEE:  Ocular Hx- PMH-    CURRENT MEDICATIONS: No current outpatient medications on file. (Ophthalmic Drugs)   No current facility-administered medications for this visit. (Ophthalmic Drugs)   Current Outpatient Medications (Other)  Medication Sig   cholecalciferol (VITAMIN D3) 25 MCG (1000 UNIT) tablet Take 1,000 Units by mouth daily.   MAGNESIUM PO Take 250-500 mg by mouth daily.   Multiple Vitamin (MULTIVITAMIN WITH MINERALS) TABS tablet Take 1 tablet by mouth daily.   pravastatin (PRAVACHOL) 20 MG tablet Take 20 mg by mouth daily.   acetaminophen (TYLENOL) 325 MG tablet Take 2 tablets (650 mg total) by mouth every 6 (six) hours as needed for mild pain. (Patient not taking: Reported on 05/25/2022)   oxyCODONE (OXY IR/ROXICODONE) 5 MG immediate release tablet Take 1 tablet (5 mg total) by mouth every 6 (six) hours as needed for severe pain. (Patient  not taking: Reported on 05/25/2022)   No current facility-administered medications for this visit. (Other)   REVIEW OF SYSTEMS: ROS   Positive for: Gastrointestinal, Musculoskeletal, Eyes Negative for: Constitutional, Neurological, Skin, Genitourinary, HENT, Endocrine, Cardiovascular, Respiratory, Psychiatric, Allergic/Imm, Heme/Lymph Last edited by Lana Fish, COT on 08/06/2023  1:28 PM.     ALLERGIES No Known Allergies  PAST MEDICAL HISTORY Past Medical History:  Diagnosis Date   Hyperlipidemia    Osteoporosis    History reviewed. No pertinent surgical history.  FAMILY HISTORY Family History  Problem Relation Age of Onset   Diabetes Sister    Glaucoma Paternal Grandmother    Diabetes Paternal Grandmother    Glaucoma Paternal Grandfather    SOCIAL HISTORY Social History   Tobacco Use   Smoking status: Never  Vaping Use   Vaping status: Never Used  Substance Use Topics   Alcohol use: Yes    Comment: social   Drug use: Never       OPHTHALMIC EXAM:  Base Eye Exam     Visual Acuity (Snellen - Linear)       Right Left   Dist cc 20/25 20/40+2   Dist ph cc 20/NI 20/25    Correction: Glasses         Tonometry (Tonopen, 1:34 PM)       Right Left   Pressure 13 14         Pupils  Dark Light Shape React APD   Right 3 2 Round Brisk None   Left 3 2 Round Brisk None         Visual Fields (Counting fingers)       Left Right    Full Full         Extraocular Movement       Right Left    Full, Ortho Full, Ortho         Neuro/Psych     Oriented x3: Yes   Mood/Affect: Normal         Dilation     Both eyes: 1.0% Mydriacyl, 2.5% Phenylephrine @ 1:34 PM           Slit Lamp and Fundus Exam     Slit Lamp Exam       Right Left   Lids/Lashes Dermatochalasis - upper lid, Meibomian gland dysfunction Dermatochalasis - upper lid, Meibomian gland dysfunction   Conjunctiva/Sclera white and quiet white and quiet, mild temporal  pinguecula   Cornea mild arcus, trace PEE mild arcus, trace tear film debris, trace Punctate epithelial erosions   Anterior Chamber deep and clear deep and clear   Iris round and dilated round and dilated   Lens 2+ NS, 2+ cortical 2+ NS, 2+ cortical   Anterior Vitreous mild syneresis; PVD mild syneresis, PVD, weiss ring, vitreous condensations         Fundus Exam       Right Left   Disc pink and sharp, compact pink and sharp, compact, mild PPA   C/D Ratio 0.1 0.2   Macula flat, blunted foveal reflex, mild RPE mottling, no heme or edema, mild ERM with early striae flat, blunted foveal reflex, ERM with striae and lamellar hole   Vessels mild cooper wiring, attenuated, mild tortuosity, mild AV crossing changes attenuated, mild tortuosity   Periphery Attached, patches of pigmented lattice from 0500-0730 and at 1100; focal hypopigmented CR atrophy at 0800 equator, good laser surrounding all lesions, no new RT/RD/lattice Attached, pigmented lattice from 0530-0730 and at 1200; pigmented VR tuft at 1030, good laser changes surrounding all lesions, no new RT/RD/lattice           Refraction     Wearing Rx       Sphere Cylinder Axis Add   Right -5.25 +2.00 098 +2.00   Left -6.75 +1.25 088 +2.00           IMAGING AND PROCEDURES  Imaging and Procedures for 08/06/2023  OCT, Retina - OU - Both Eyes       Right Eye Quality was good. Central Foveal Thickness: 481. Progression has worsened. Findings include no IRF, no SRF, abnormal foveal contour, epiretinal membrane (ERM with blunting of foveal contour- progression of central thickening and pucker ).   Left Eye Quality was good. Central Foveal Thickness: 410. Progression has been stable. Findings include no SRF, abnormal foveal contour, epiretinal membrane, intraretinal fluid, lamellar hole, preretinal fibrosis (Stable ERM with lamellar macular hole, +IRF, +pucker and PRF; inferior VR tuft caught on widefield -- not imaged today).    Notes *Images captured and stored on drive  Diagnosis / Impression:  OD: ERM with blunting of foveal contour- progression of central thickening and pucker OS: stable ERM with lamellar macular hole with IRF, pucker and PRF--stable; inferior VR tuft caught on widefield -- not imaged today   Clinical management:  See below  Abbreviations: NFP - Normal foveal profile. CME - cystoid macular edema. PED - pigment epithelial detachment.  IRF - intraretinal fluid. SRF - subretinal fluid. EZ - ellipsoid zone. ERM - epiretinal membrane. ORA - outer retinal atrophy. ORT - outer retinal tubulation. SRHM - subretinal hyper-reflective material. IRHM - intraretinal hyper-reflective material            ASSESSMENT/PLAN:    ICD-10-CM   1. Epiretinal membrane (ERM) of both eyes  H35.373 OCT, Retina - OU - Both Eyes    2. 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 Retinal hole of both eyes  H33.323     5. Combined forms of age-related cataract of both eyes  H25.813      1,2. Epiretinal membrane OU (OD>OS) - OD: ERM with blunting of foveal contour -- progression of central thickening and pucker - OS: mild ERM with lamellar hole and cystic changes -- stable - BCVA 20/25 OD; 20/25 OS, asymptomatic, no metamorphopsia - no indication for /surgery at this time - monitor for now - f/u 3-4 mos - DFE/OCT  3,4. Lattice degeneration w/ atrophic holes OU - OD: pigmented lattice at 1100 and 0530 to 0700, and focal hypopigmented CR atrophy at 0800 equator - OS: pigmented lattice [phone], 0730 and 1200; pigmented VR tuft at 1030 - s/p laser retinopexy OD (09.18.23) -- good laser surrounding all lesions - s/p laser retinopexy OS (10.11.23) -- good laser surrounding all lesions - no new RT/RD or lattice - monitor  5. Mixed cataracts OU - The symptoms of cataract, surgical options, and treatments and risks were discussed with patient. -  discussed diagnosis and progression - not yet visually significant - monitor for now  Ophthalmic Meds Ordered this visit:  No orders of the defined types were placed in this encounter.    Return in about 4 months (around 12/04/2023) for f/u ERM OU, DFE, OCT.  There are no Patient Instructions on file for this visit.  Explained the diagnoses, plan, and follow up with the patient and they expressed understanding.  Patient expressed understanding of the importance of proper follow up care.  This document serves as a record of services personally performed by Karie Chimera, MD, PhD. It was created on their behalf by Charlette Caffey, COT an ophthalmic technician. The creation of this record is the provider's dictation and/or activities during the visit.    Electronically signed by:  Charlette Caffey, COT  08/06/23 10:06 PM  Karie Chimera, M.D., Ph.D. Diseases & Surgery of the Retina and Vitreous [facility]  I have reviewed the above documentation for accuracy and completeness, and I agree with the above. Karie Chimera, M.D., Ph.D. 08/06/23 10:30 PM  Abbreviations: M myopia (nearsighted); A astigmatism; H hyperopia (farsighted); P presbyopia; Mrx spectacle prescription;  CTL contact lenses; OD right eye; OS left eye; OU both eyes  XT exotropia; ET esotropia; PEK punctate epithelial keratitis; PEE punctate epithelial erosions; DES dry eye syndrome; MGD meibomian gland dysfunction; ATs artificial tears; PFAT's preservative free artificial tears; NSC nuclear sclerotic cataract; PSC posterior subcapsular cataract; ERM epi-retinal membrane; PVD posterior vitreous detachment; RD retinal detachment; DM diabetes mellitus; DR diabetic retinopathy; NPDR non-proliferative diabetic retinopathy; PDR proliferative diabetic retinopathy; CSME clinically significant macular edema; DME diabetic macular edema; dbh dot blot hemorrhages; CWS cotton wool spot; POAG primary open angle  glaucoma; C/D cup-to-disc ratio; HVF humphrey visual field; GVF goldmann visual field; OCT optical coherence tomography; IOP intraocular pressure; BRVO Branch retinal  vein occlusion; CRVO central retinal vein occlusion; CRAO central retinal artery occlusion; BRAO branch retinal artery occlusion; RT retinal tear; SB scleral buckle; PPV pars plana vitrectomy; VH Vitreous hemorrhage; PRP panretinal laser photocoagulation; IVK intravitreal kenalog; VMT vitreomacular traction; MH Macular hole;  NVD neovascularization of the disc; NVE neovascularization elsewhere; AREDS age related eye disease study; ARMD age related macular degeneration; POAG primary open angle glaucoma; EBMD epithelial/anterior basement membrane dystrophy; ACIOL anterior chamber intraocular lens; IOL intraocular lens; PCIOL posterior chamber intraocular lens; Phaco/IOL phacoemulsification with intraocular lens placement; PRK photorefractive keratectomy; LASIK laser assisted in situ keratomileusis; HTN hypertension; DM diabetes mellitus; COPD chronic obstructive pulmonary disease

## 2023-08-06 ENCOUNTER — Encounter (INDEPENDENT_AMBULATORY_CARE_PROVIDER_SITE_OTHER): Payer: BC Managed Care – PPO | Admitting: Ophthalmology

## 2023-08-06 ENCOUNTER — Encounter (INDEPENDENT_AMBULATORY_CARE_PROVIDER_SITE_OTHER): Payer: Self-pay | Admitting: Ophthalmology

## 2023-08-06 ENCOUNTER — Ambulatory Visit (INDEPENDENT_AMBULATORY_CARE_PROVIDER_SITE_OTHER): Payer: BC Managed Care – PPO | Admitting: Ophthalmology

## 2023-08-06 DIAGNOSIS — H35342 Macular cyst, hole, or pseudohole, left eye: Secondary | ICD-10-CM

## 2023-08-06 DIAGNOSIS — H33323 Round hole, bilateral: Secondary | ICD-10-CM

## 2023-08-06 DIAGNOSIS — H25813 Combined forms of age-related cataract, bilateral: Secondary | ICD-10-CM | POA: Diagnosis not present

## 2023-08-06 DIAGNOSIS — H35373 Puckering of macula, bilateral: Secondary | ICD-10-CM | POA: Diagnosis not present

## 2023-08-06 DIAGNOSIS — H35413 Lattice degeneration of retina, bilateral: Secondary | ICD-10-CM

## 2023-08-12 ENCOUNTER — Ambulatory Visit
Admission: RE | Admit: 2023-08-12 | Discharge: 2023-08-12 | Disposition: A | Discharge status: Home / Self Care | Payer: No Typology Code available for payment source | Source: Ambulatory Visit | Attending: Obstetrics and Gynecology | Admitting: Obstetrics and Gynecology

## 2023-08-12 DIAGNOSIS — Z803 Family history of malignant neoplasm of breast: Secondary | ICD-10-CM

## 2023-08-12 MED ORDER — GADOPICLENOL 0.5 MMOL/ML IV SOLN
6.0000 mL | Freq: Once | INTRAVENOUS | Status: AC | PRN
  Administered 2023-08-12: 6 mL via INTRAVENOUS

## 2023-09-25 ENCOUNTER — Encounter: Payer: Self-pay | Admitting: Dental General Practice

## 2023-09-25 ENCOUNTER — Encounter: Payer: Self-pay | Admitting: Family Medicine

## 2023-09-25 ENCOUNTER — Encounter: Payer: Self-pay | Admitting: Pediatrics

## 2023-09-26 ENCOUNTER — Encounter: Payer: Self-pay | Admitting: Emergency Medicine

## 2023-10-31 NOTE — Progress Notes (Signed)
 [facility] - Clinic Note  11/11/2023     CHIEF COMPLAINT Patient presents for Retina Follow Up   HISTORY OF PRESENT ILLNESS: Tina Woodard is a 63 y.o. female who presents to the clinic today for:   HPI     Retina Follow Up   Patient presents with  Other.  In both eyes.  Severity is moderate.  Duration of 3 months.  Since onset it is stable.  I, the attending physician,  performed the HPI with the patient and updated documentation appropriately.        Comments   3 month Retina eval. Patient states no vision changes. Patient states eyes are dry      Last edited by Rennis Chris, MD on 11/12/2023  4:51 PM.    Patient states her vision is the same, she has not noticed any new distortion  Referring physician: Merri Brunette, MD [phone] [address] [place],  [place] [address]  HISTORICAL INFORMATION:   Selected notes from the MEDICAL RECORD NUMBER Referred by Dr. Zenaida Niece for concern of retinal hole LEE:  Ocular Hx- PMH-    CURRENT MEDICATIONS: No current outpatient medications on file. (Ophthalmic Drugs)   No current facility-administered medications for this visit. (Ophthalmic Drugs)   Current Outpatient Medications (Other)  Medication Sig   cholecalciferol (VITAMIN D3) 25 MCG (1000 UNIT) tablet Take 1,000 Units by mouth daily.   MAGNESIUM PO Take 250-500 mg by mouth daily.   pravastatin (PRAVACHOL) 20 MG tablet Take 20 mg by mouth daily.   acetaminophen (TYLENOL) 325 MG tablet Take 2 tablets (650 mg total) by mouth every 6 (six) hours as needed for mild pain. (Patient not taking: Reported on 11/11/2023)   Multiple Vitamin (MULTIVITAMIN WITH MINERALS) TABS tablet Take 1 tablet by mouth daily.   oxyCODONE (OXY IR/ROXICODONE) 5 MG immediate release tablet Take 1 tablet (5 mg total) by mouth every 6 (six) hours as needed for severe pain. (Patient not taking: Reported on 11/11/2023)   No current facility-administered medications for this visit.  (Other)   REVIEW OF SYSTEMS: ROS   Positive for: Gastrointestinal, Musculoskeletal, Eyes Negative for: Constitutional, Neurological, Skin, Genitourinary, HENT, Endocrine, Cardiovascular, Respiratory, Psychiatric, Allergic/Imm, Heme/Lymph Last edited by Lana Fish, COT on 11/11/2023  9:32 AM.     ALLERGIES No Known Allergies  PAST MEDICAL HISTORY Past Medical History:  Diagnosis Date   Hyperlipidemia    Osteoporosis    History reviewed. No pertinent surgical history.  FAMILY HISTORY Family History  Problem Relation Age of Onset   Diabetes Sister    Glaucoma Paternal Grandmother    Diabetes Paternal Grandmother    Glaucoma Paternal Grandfather    SOCIAL HISTORY Social History   Tobacco Use   Smoking status: Never  Vaping Use   Vaping status: Never Used  Substance Use Topics   Alcohol use: Yes    Comment: social   Drug use: Never       OPHTHALMIC EXAM:  Base Eye Exam     Visual Acuity (Snellen - Linear)       Right Left   Dist cc 20/25 20/30-2   Dist ph cc 20/NI 20/25-2    Correction: Glasses         Tonometry (Tonopen, 9:34 AM)       Right Left   Pressure 10 12         Pupils       Dark Light Shape React APD   Right  3 2 Round Brisk None   Left 3 2 Round Brisk None         Visual Fields (Counting fingers)       Left Right    Full Full         Extraocular Movement       Right Left    Full, Ortho Full, Ortho         Neuro/Psych     Oriented x3: Yes   Mood/Affect: Normal         Dilation     Both eyes: 1.0% Mydriacyl, 2.5% Phenylephrine @ 9:34 AM           Slit Lamp and Fundus Exam     Slit Lamp Exam       Right Left   Lids/Lashes Dermatochalasis - upper lid, Meibomian gland dysfunction Dermatochalasis - upper lid, Meibomian gland dysfunction   Conjunctiva/Sclera white and quiet white and quiet, mild temporal pinguecula   Cornea mild arcus, mild tear film debris mild arcus, mild tear film debris   Anterior  Chamber deep and clear deep and clear   Iris round and dilated round and dilated   Lens 2+ NS, 2+ cortical 2+ NS, 2+ cortical   Anterior Vitreous mild syneresis; PVD mild syneresis, PVD, weiss ring, vitreous condensations         Fundus Exam       Right Left   Disc pink and sharp, compact pink and sharp, compact, mild PPA   C/D Ratio 0.1 0.2   Macula flat, blunted foveal reflex, mild RPE mottling, no heme or edema, ERM with early striae flat, blunted foveal reflex, ERM with striae and lamellar hole, central cystic changes, no heme   Vessels mild attenuation, mild copper wiring, mild AV crossing changes attenuated, mild tortuosity   Periphery Attached, patches of pigmented lattice from 0500-0730 and at 1100; focal hypopigmented CR atrophy at 0800 equator, good laser surrounding all lesions, no new RT/RD/lattice Attached, pigmented lattice from 0530-0730 and at 1200; pigmented VR tuft at 1030, good laser changes surrounding all lesions, no new RT/RD/lattice           Refraction     Wearing Rx       Sphere Cylinder Axis Add   Right -5.25 +2.00 098 +2.00   Left -6.75 +1.25 088 +2.00           IMAGING AND PROCEDURES  Imaging and Procedures for 11/11/2023  OCT, Retina - OU - Both Eyes       Right Eye Quality was good. Central Foveal Thickness: 501. Progression has been stable. Findings include no IRF, no SRF, abnormal foveal contour, epiretinal membrane (ERM with blunting of foveal contour -- +central thickening and pucker ).   Left Eye Quality was good. Central Foveal Thickness: 375. Progression has been stable. Findings include no SRF, abnormal foveal contour, epiretinal membrane, intraretinal fluid, lamellar hole, preretinal fibrosis (Stable ERM with lamellar macular hole, +cystic changes, +pucker and PRF; inferior VR tuft caught on widefield -- not imaged today).   Notes *Images captured and stored on drive  Diagnosis / Impression:  OD: ERM with blunting of foveal  contour -- +central thickening and pucker OS: stable ERM with lamellar macular hole, +cystic changes, pucker and PRF--stable; inferior VR tuft caught on widefield -- not imaged today   Clinical management:  See below  Abbreviations: NFP - Normal foveal profile. CME - cystoid macular edema. PED - pigment epithelial detachment. IRF - intraretinal fluid. SRF - subretinal  fluid. EZ - ellipsoid zone. ERM - epiretinal membrane. ORA - outer retinal atrophy. ORT - outer retinal tubulation. SRHM - subretinal hyper-reflective material. IRHM - intraretinal hyper-reflective material            ASSESSMENT/PLAN:    ICD-10-CM   1. Epiretinal membrane (ERM) of both eyes  H35.373 OCT, Retina - OU - Both Eyes    2. 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 Retinal hole of both eyes  H33.323     5. Combined forms of age-related cataract of both eyes  H25.813      1,2. Epiretinal membrane OU (OD>OS) - OD: ERM with blunting of foveal contour -- +central thickening and pucker - OS: stable ERM with lamellar macular hole, +cystic changes, pucker and PRF--stable; inferior VR tuft caught on widefield -- not imaged today - BCVA 20/25 OD; 20/25 OS, asymptomatic, no metamorphopsia - no indication for surgery at this time - monitor - f/u 4 mos - DFE/OCT  3,4. Lattice degeneration w/ atrophic holes OU - OD: pigmented lattice at 1100 and 0530 to 0700, and focal hypopigmented CR atrophy at 0800 equator - OS: pigmented lattice [phone], 0730 and 1200; pigmented VR tuft at 1030 - s/p laser retinopexy OD (09.18.23) -- good laser surrounding all lesions - s/p laser retinopexy OS (10.11.23) -- good laser surrounding all lesions - no new RT/RD or lattice - monitor  5. Mixed cataracts OU - The symptoms of cataract, surgical options, and treatments and risks were discussed with patient. - discussed diagnosis and progression - not yet visually  significant - monitor for now  Ophthalmic Meds Ordered this visit:  No orders of the defined types were placed in this encounter.    Return in about 4 months (around 03/10/2024) for f/u ERM OU, DFE, OCT.  There are no Patient Instructions on file for this visit.  Explained the diagnoses, plan, and follow up with the patient and they expressed understanding.  Patient expressed understanding of the importance of proper follow up care.  This document serves as a record of services personally performed by Karie Chimera, MD, PhD. It was created on their behalf by Glee Arvin. Manson Passey, OA an ophthalmic technician. The creation of this record is the provider's dictation and/or activities during the visit.    Electronically signed by: Glee Arvin. Manson Passey, OA 11/12/23 4:52 PM  Karie Chimera, M.D., Ph.D. Diseases & Surgery of the Retina and Vitreous [facility]  I have reviewed the above documentation for accuracy and completeness, and I agree with the above. Karie Chimera, M.D., Ph.D. 11/12/23 4:54 PM   Abbreviations: M myopia (nearsighted); A astigmatism; H hyperopia (farsighted); P presbyopia; Mrx spectacle prescription;  CTL contact lenses; OD right eye; OS left eye; OU both eyes  XT exotropia; ET esotropia; PEK punctate epithelial keratitis; PEE punctate epithelial erosions; DES dry eye syndrome; MGD meibomian gland dysfunction; ATs artificial tears; PFAT's preservative free artificial tears; NSC nuclear sclerotic cataract; PSC posterior subcapsular cataract; ERM epi-retinal membrane; PVD posterior vitreous detachment; RD retinal detachment; DM diabetes mellitus; DR diabetic retinopathy; NPDR non-proliferative diabetic retinopathy; PDR proliferative diabetic retinopathy; CSME clinically significant macular edema; DME diabetic macular edema; dbh dot blot hemorrhages; CWS cotton wool spot; POAG primary open angle glaucoma; C/D cup-to-disc ratio; HVF humphrey visual field; GVF  goldmann visual field; OCT optical coherence tomography; IOP intraocular pressure; BRVO Branch retinal vein  occlusion; CRVO central retinal vein occlusion; CRAO central retinal artery occlusion; BRAO branch retinal artery occlusion; RT retinal tear; SB scleral buckle; PPV pars plana vitrectomy; VH Vitreous hemorrhage; PRP panretinal laser photocoagulation; IVK intravitreal kenalog; VMT vitreomacular traction; MH Macular hole;  NVD neovascularization of the disc; NVE neovascularization elsewhere; AREDS age related eye disease study; ARMD age related macular degeneration; POAG primary open angle glaucoma; EBMD epithelial/anterior basement membrane dystrophy; ACIOL anterior chamber intraocular lens; IOL intraocular lens; PCIOL posterior chamber intraocular lens; Phaco/IOL phacoemulsification with intraocular lens placement; PRK photorefractive keratectomy; LASIK laser assisted in situ keratomileusis; HTN hypertension; DM diabetes mellitus; COPD chronic obstructive pulmonary disease

## 2023-11-11 ENCOUNTER — Encounter (INDEPENDENT_AMBULATORY_CARE_PROVIDER_SITE_OTHER): Payer: Self-pay | Admitting: Ophthalmology

## 2023-11-11 ENCOUNTER — Encounter (INDEPENDENT_AMBULATORY_CARE_PROVIDER_SITE_OTHER): Payer: Medicare PPO | Admitting: Ophthalmology

## 2023-11-11 ENCOUNTER — Ambulatory Visit (INDEPENDENT_AMBULATORY_CARE_PROVIDER_SITE_OTHER): Payer: Medicare PPO | Admitting: Ophthalmology

## 2023-11-11 DIAGNOSIS — H35373 Puckering of macula, bilateral: Secondary | ICD-10-CM

## 2023-11-11 DIAGNOSIS — H25813 Combined forms of age-related cataract, bilateral: Secondary | ICD-10-CM

## 2023-11-11 DIAGNOSIS — H33323 Round hole, bilateral: Secondary | ICD-10-CM | POA: Diagnosis not present

## 2023-11-11 DIAGNOSIS — H35342 Macular cyst, hole, or pseudohole, left eye: Secondary | ICD-10-CM | POA: Diagnosis not present

## 2023-11-11 DIAGNOSIS — H35413 Lattice degeneration of retina, bilateral: Secondary | ICD-10-CM | POA: Diagnosis not present

## 2023-11-12 ENCOUNTER — Encounter (INDEPENDENT_AMBULATORY_CARE_PROVIDER_SITE_OTHER): Payer: Self-pay | Admitting: Ophthalmology

## 2023-12-02 ENCOUNTER — Other Ambulatory Visit (HOSPITAL_COMMUNITY): Payer: Self-pay | Admitting: Family Medicine

## 2023-12-02 DIAGNOSIS — Z8249 Family history of ischemic heart disease and other diseases of the circulatory system: Secondary | ICD-10-CM

## 2023-12-06 ENCOUNTER — Ambulatory Visit (HOSPITAL_COMMUNITY)
Admission: RE | Admit: 2023-12-06 | Discharge: 2023-12-06 | Disposition: A | Discharge status: Home / Self Care | Payer: Self-pay | Source: Ambulatory Visit | Attending: Family Medicine | Admitting: Family Medicine

## 2023-12-06 DIAGNOSIS — Z8249 Family history of ischemic heart disease and other diseases of the circulatory system: Secondary | ICD-10-CM | POA: Insufficient documentation

## 2024-02-11 ENCOUNTER — Ambulatory Visit: Admitting: Podiatry

## 2024-02-11 DIAGNOSIS — M8580 Other specified disorders of bone density and structure, unspecified site: Secondary | ICD-10-CM | POA: Insufficient documentation

## 2024-02-11 DIAGNOSIS — B351 Tinea unguium: Secondary | ICD-10-CM

## 2024-02-11 DIAGNOSIS — E785 Hyperlipidemia, unspecified: Secondary | ICD-10-CM | POA: Insufficient documentation

## 2024-02-11 NOTE — Progress Notes (Signed)
      Subjective:  Patient ID: Tina Woodard, female    DOB: 1961/07/28,  MRN: [id number]  Tina Woodard presents to clinic today for fungal nail check.  She believes that she has fungus in the left 1st, 4th, and 5th toenails.  States that she has tried multiple over-the-counter treatments without success.  Denies fungal involvement on the right toenails  PCP is Faustina Hood, MD.  Past Medical History:  Diagnosis Date   Hyperlipidemia    Osteoporosis    No past surgical history on file. No Known Allergies  Review of Systems: Negative except as noted in the HPI.  Objective:  Vascular Examination: Capillary refill time is 3-5 seconds to toes bilateral. Palpable pedal pulses b/l LE. Digital hair present b/l.    Dermatological Examination: Pedal skin with normal turgor, texture and tone b/l. No open wounds. No interdigital macerations b/l.  The left 1st, 4th, and 5th toenails are 3mm thick, discolored yellow, dystrophic with subungual debris. There is pain with compression of the nail plates.  No periungual erythema is noted  Assessment/Plan: 1. Dermatophytosis of nail    Discussed topical, oral, and laser nail treatment options with the patient today.  Discussed risks involved as well.    She plans to proceed with the laser treatment for toenails 1, 4, and 5 on the left foot.  Will have her follow-up with me after her final laser treatment.  The nails were already neatly trimmed by the patient, and there was no excessive length to be able to trim or collect samples to send for fungal nail culture.    Joe Murders, DPM, FACFAS [facility]     [address] [place], [place] [address]                Office [phone]  Fax [phone]

## 2024-03-03 ENCOUNTER — Ambulatory Visit (INDEPENDENT_AMBULATORY_CARE_PROVIDER_SITE_OTHER)

## 2024-03-03 DIAGNOSIS — B351 Tinea unguium: Secondary | ICD-10-CM

## 2024-03-03 NOTE — Progress Notes (Signed)
 Patient presents today for the 1st laser treatment. Diagnosed with mycotic nail infection by Dr. Estle Hemp.   Toenail most affected left 1st, 4th and 5th.  All other systems are negative. Patient denies oral or topical treatments.   Nails were filed thin. Laser therapy was administered to 1st, 4th and 5th toenails left side and patient tolerated the treatment well. All safety precautions were in place.   Patient did feel the arc/spark of laser on the left 4th, but no visible signs on toe of injury noted. Patient was okay with proceeding with treatment completion.   Follow up in 4 weeks for laser # 2.

## 2024-03-03 NOTE — Patient Instructions (Signed)

## 2024-03-16 ENCOUNTER — Encounter (INDEPENDENT_AMBULATORY_CARE_PROVIDER_SITE_OTHER): Payer: Medicare PPO | Admitting: Ophthalmology

## 2024-03-17 NOTE — Progress Notes (Signed)
 [facility] - Clinic Note  03/23/2024     CHIEF COMPLAINT Patient presents for Retina Follow Up   HISTORY OF PRESENT ILLNESS: Tina Woodard is a 63 y.o. female who presents to the clinic today for:   HPI     Retina Follow Up   In both eyes.  This started 4 months ago.  Duration of 4 months.  Since onset it is stable.  I, the attending physician,  performed the HPI with the patient and updated documentation appropriately.        Comments   4 month retina follow up ERM OU pt is reporting no vision changes noticed she denies any flashes some more floaters OS       Last edited by Valdemar Rogue, MD on 03/23/2024  5:09 PM.    Patient states her vision is alright, she denies distortion, but notices occasional blurriness especially when trying to read, she endorses seeing floaters, but no fol  Referring physician: Claudene Pellet, MD [phone] [address] [place],  [place] [address]  HISTORICAL INFORMATION:   Selected notes from the MEDICAL RECORD NUMBER Referred by Dr. Fleeta for concern of retinal hole LEE:  Ocular Hx- PMH-    CURRENT MEDICATIONS: No current outpatient medications on file. (Ophthalmic Drugs)   No current facility-administered medications for this visit. (Ophthalmic Drugs)   Current Outpatient Medications (Other)  Medication Sig   acetaminophen  (TYLENOL ) 325 MG tablet Take 2 tablets (650 mg total) by mouth every 6 (six) hours as needed for mild pain. (Patient not taking: Reported on 11/11/2023)   cholecalciferol (VITAMIN D3) 25 MCG (1000 UNIT) tablet Take 1,000 Units by mouth daily.   MAGNESIUM PO Take 250-500 mg by mouth daily.   Multiple Vitamin (MULTIVITAMIN WITH MINERALS) TABS tablet Take 1 tablet by mouth daily.   oxyCODONE  (OXY IR/ROXICODONE ) 5 MG immediate release tablet Take 1 tablet (5 mg total) by mouth every 6 (six) hours as needed for severe pain. (Patient not taking: Reported on 11/11/2023)   pravastatin (PRAVACHOL) 20 MG  tablet Take 20 mg by mouth daily.   No current facility-administered medications for this visit. (Other)   REVIEW OF SYSTEMS: ROS   Positive for: Gastrointestinal, Musculoskeletal, Eyes Negative for: Constitutional, Neurological, Skin, Genitourinary, HENT, Endocrine, Cardiovascular, Respiratory, Psychiatric, Allergic/Imm, Heme/Lymph Last edited by Resa Delon ORN, COT on 03/23/2024  1:52 PM.     ALLERGIES No Known Allergies  PAST MEDICAL HISTORY Past Medical History:  Diagnosis Date   Hyperlipidemia    Osteoporosis    History reviewed. No pertinent surgical history.  FAMILY HISTORY Family History  Problem Relation Age of Onset   Diabetes Sister    Glaucoma Paternal Grandmother    Diabetes Paternal Grandmother    Glaucoma Paternal Grandfather    SOCIAL HISTORY Social History   Tobacco Use   Smoking status: Never  Vaping Use   Vaping status: Never Used  Substance Use Topics   Alcohol use: Yes    Comment: social   Drug use: Never       OPHTHALMIC EXAM:  Base Eye Exam     Visual Acuity (Snellen - Linear)       Right Left   Dist cc 20/25 -3 20/25   Dist ph cc NI NI         Tonometry (Tonopen, 1:55 PM)       Right Left   Pressure 14 13         Pupils  Pupils Dark Light Shape React APD   Right PERRL 3 2 Round Brisk None   Left PERRL 3 2 Round Brisk None         Visual Fields       Left Right    Full Full         Extraocular Movement       Right Left    Full, Ortho Full, Ortho         Neuro/Psych     Oriented x3: Yes   Mood/Affect: Normal         Dilation     Both eyes: 2.5% Phenylephrine @ 1:55 PM           Slit Lamp and Fundus Exam     Slit Lamp Exam       Right Left   Lids/Lashes Dermatochalasis - upper lid, Meibomian gland dysfunction Dermatochalasis - upper lid, Meibomian gland dysfunction   Conjunctiva/Sclera white and quiet white and quiet, mild temporal pinguecula   Cornea mild arcus, mild tear  film debris, trace PEE mild arcus, mild tear film debris, trace PEE   Anterior Chamber deep and clear deep and clear   Iris round and dilated round and dilated   Lens 2+ NS, 2-3+ cortical 2+ NS, 2+ cortical   Anterior Vitreous mild syneresis; PVD mild syneresis, PVD, weiss ring, vitreous condensations         Fundus Exam       Right Left   Disc pink and sharp, compact pink and sharp, compact, mild PPA   C/D Ratio 0.1 0.2   Macula flat, blunted foveal reflex, mild RPE mottling, no heme or edema, ERM with striae, focal PRF superior macula flat, blunted foveal reflex, ERM with striae and lamellar hole, central cystic changes, PRF superior mac, no heme   Vessels mild attenuation, mild copper wiring, mild AV crossing changes attenuated, mild tortuosity   Periphery Attached, patches of pigmented lattice from 0500-0730 and at 1100; focal hypopigmented CR atrophy at 0800 equator, good laser surrounding all lesions, no new RT/RD/lattice Attached, pigmented lattice from 0530-0730 and at 1200; pigmented VR tuft at 1030, good laser changes surrounding all lesions, no new RT/RD/lattice           Refraction     Wearing Rx       Sphere Cylinder Axis Add   Right -5.25 +2.00 098 +2.00   Left -6.75 +1.25 088 +2.00           IMAGING AND PROCEDURES  Imaging and Procedures for 03/23/2024  OCT, Retina - OU - Both Eyes       Right Eye Quality was good. Central Foveal Thickness: 521. Progression has been stable. Findings include no IRF, no SRF, abnormal foveal contour, epiretinal membrane (ERM with blunting of foveal contour -- +central thickening and pucker; ?increase in ERM hyper reflectivity ).   Left Eye Quality was good. Central Foveal Thickness: 404. Progression has been stable. Findings include no SRF, abnormal foveal contour, epiretinal membrane, intraretinal fluid, lamellar hole, preretinal fibrosis (Stable ERM with lamellar macular hole, +cystic changes, +pucker and PRF; inferior VR  tuft caught on widefield -- not imaged today).   Notes *Images captured and stored on drive  Diagnosis / Impression:  OD: ERM with blunting of foveal contour -- +central thickening and pucker; ?increase in ERM hyper reflectivity OS: stable ERM with lamellar macular hole, +cystic changes, pucker and PRF--stable; inferior VR tuft caught on widefield -- not imaged today   Clinical management:  See below  Abbreviations: NFP - Normal foveal profile. CME - cystoid macular edema. PED - pigment epithelial detachment. IRF - intraretinal fluid. SRF - subretinal fluid. EZ - ellipsoid zone. ERM - epiretinal membrane. ORA - outer retinal atrophy. ORT - outer retinal tubulation. SRHM - subretinal hyper-reflective material. IRHM - intraretinal hyper-reflective material             ASSESSMENT/PLAN:    ICD-10-CM   1. Epiretinal membrane (ERM) of both eyes  H35.373 OCT, Retina - OU - Both Eyes    2. 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 Retinal hole of both eyes  H33.323     5. Combined forms of age-related cataract of both eyes  H25.813      1,2. Epiretinal membrane OU (OD>OS) - OD: ERM with blunting of foveal contour -- +central thickening and pucker; ?increase in ERM hyper reflectivity  - OS: stable ERM with lamellar macular hole, +cystic changes, pucker and PRF--stable; inferior VR tuft caught on widefield -- not imaged today - BCVA 20/25 OD; 20/25 OS -- stable OU - asymptomatic, no metamorphopsia - no indication for surgery at this time - monitor  - f/u 4-6 mos, sooner prn- DFE/OCT  3,4. Lattice degeneration w/ atrophic holes OU - OD: pigmented lattice at 1100 and 0530 to 0700, and focal hypopigmented CR atrophy at 0800 equator - OS: pigmented lattice [phone], 0730 and 1200; pigmented VR tuft at 1030 - s/p laser retinopexy OD (09.18.23) -- good laser surrounding all lesions - s/p laser retinopexy OS (10.11.23) --  good laser surrounding all lesions - no new RT/RD or lattice OU - monitor  5. Mixed cataracts OU - The symptoms of cataract, surgical options, and treatments and risks were discussed with patient. - discussed diagnosis and progression - not yet visually significant - monitor  Ophthalmic Meds Ordered this visit:  No orders of the defined types were placed in this encounter.    Return for f/u 4-6 months ERM OU, DFE, OCT.  There are no Patient Instructions on file for this visit.  Explained the diagnoses, plan, and follow up with the patient and they expressed understanding.  Patient expressed understanding of the importance of proper follow up care.  This document serves as a record of services personally performed by Redell JUDITHANN Hans, MD, PhD. It was created on their behalf by Avelina Pereyra, COA an ophthalmic technician. The creation of this record is the provider's dictation and/or activities during the visit.   Electronically signed by: Avelina GORMAN Pereyra, COT  03/23/24  5:09 PM   This document serves as a record of services personally performed by Redell JUDITHANN Hans, MD, PhD. It was created on their behalf by Alan PARAS. Delores, OA an ophthalmic technician. The creation of this record is the provider's dictation and/or activities during the visit.    Electronically signed by: Alan PARAS. Delores, OA 03/23/24 5:09 PM  Redell JUDITHANN Hans, M.D., Ph.D. Diseases & Surgery of the Retina and Vitreous [facility]  I have reviewed the above documentation for accuracy and completeness, and I agree with the above. Redell JUDITHANN Hans, M.D., Ph.D. 03/23/24 5:11 PM   Abbreviations: M myopia (nearsighted); A astigmatism; H hyperopia (farsighted); P presbyopia; Mrx spectacle prescription;  CTL contact lenses; OD right eye; OS left eye; OU both eyes  XT exotropia; ET esotropia; PEK punctate epithelial keratitis; PEE punctate epithelial erosions;  DES dry eye syndrome; MGD meibomian gland  dysfunction; ATs artificial tears; PFAT's preservative free artificial tears; NSC nuclear sclerotic cataract; PSC posterior subcapsular cataract; ERM epi-retinal membrane; PVD posterior vitreous detachment; RD retinal detachment; DM diabetes mellitus; DR diabetic retinopathy; NPDR non-proliferative diabetic retinopathy; PDR proliferative diabetic retinopathy; CSME clinically significant macular edema; DME diabetic macular edema; dbh dot blot hemorrhages; CWS cotton wool spot; POAG primary open angle glaucoma; C/D cup-to-disc ratio; HVF humphrey visual field; GVF goldmann visual field; OCT optical coherence tomography; IOP intraocular pressure; BRVO Branch retinal vein occlusion; CRVO central retinal vein occlusion; CRAO central retinal artery occlusion; BRAO branch retinal artery occlusion; RT retinal tear; SB scleral buckle; PPV pars plana vitrectomy; VH Vitreous hemorrhage; PRP panretinal laser photocoagulation; IVK intravitreal kenalog; VMT vitreomacular traction; MH Macular hole;  NVD neovascularization of the disc; NVE neovascularization elsewhere; AREDS age related eye disease study; ARMD age related macular degeneration; POAG primary open angle glaucoma; EBMD epithelial/anterior basement membrane dystrophy; ACIOL anterior chamber intraocular lens; IOL intraocular lens; PCIOL posterior chamber intraocular lens; Phaco/IOL phacoemulsification with intraocular lens placement; PRK photorefractive keratectomy; LASIK laser assisted in situ keratomileusis; HTN hypertension; DM diabetes mellitus; COPD chronic obstructive pulmonary disease

## 2024-03-23 ENCOUNTER — Encounter (INDEPENDENT_AMBULATORY_CARE_PROVIDER_SITE_OTHER): Payer: Self-pay | Admitting: Ophthalmology

## 2024-03-23 ENCOUNTER — Ambulatory Visit (INDEPENDENT_AMBULATORY_CARE_PROVIDER_SITE_OTHER): Admitting: Ophthalmology

## 2024-03-23 DIAGNOSIS — H35342 Macular cyst, hole, or pseudohole, left eye: Secondary | ICD-10-CM | POA: Diagnosis not present

## 2024-03-23 DIAGNOSIS — H35413 Lattice degeneration of retina, bilateral: Secondary | ICD-10-CM | POA: Diagnosis not present

## 2024-03-23 DIAGNOSIS — H33323 Round hole, bilateral: Secondary | ICD-10-CM | POA: Diagnosis not present

## 2024-03-23 DIAGNOSIS — N958 Other specified menopausal and perimenopausal disorders: Secondary | ICD-10-CM | POA: Diagnosis not present

## 2024-03-23 DIAGNOSIS — H25813 Combined forms of age-related cataract, bilateral: Secondary | ICD-10-CM

## 2024-03-23 DIAGNOSIS — H35373 Puckering of macula, bilateral: Secondary | ICD-10-CM

## 2024-03-23 DIAGNOSIS — M816 Localized osteoporosis [Lequesne]: Secondary | ICD-10-CM | POA: Diagnosis not present

## 2024-03-24 ENCOUNTER — Ambulatory Visit (INDEPENDENT_AMBULATORY_CARE_PROVIDER_SITE_OTHER)

## 2024-03-24 DIAGNOSIS — B351 Tinea unguium: Secondary | ICD-10-CM

## 2024-03-24 NOTE — Progress Notes (Signed)
 Patient presents today for the 2nd laser treatment. Diagnosed with mycotic nail infection by Dr. Loel.   Toenail most affected left 1st, 4th and 5th.  All other systems are negative.  Nails were filed thin. Laser therapy was administered to 1st, 4th and 5th toenails left foot and patient tolerated the treatment well. All safety precautions were in place. Patient had trimmed her left 1st, 4th and 5th toenails quite a bit between treatments. The nails are so short that they are behind the nail bed. Patient did have some tenderness when using dremmel and burr tool. Advised patient not to cut those nails back between treatments for the time being so that they can grow out some. Patient verbalized understanding and will come back for next laser treatment.    Patient had remnants of dark nail polish on her nails so she did notice the laser reacting to this again today. No visual injuries or burns were noted upon leaving office and no other symptoms reported by patient.   Follow up in 4 weeks for laser # 3.

## 2024-03-31 ENCOUNTER — Other Ambulatory Visit

## 2024-04-02 DIAGNOSIS — F5104 Psychophysiologic insomnia: Secondary | ICD-10-CM | POA: Diagnosis not present

## 2024-04-02 DIAGNOSIS — Z6821 Body mass index (BMI) 21.0-21.9, adult: Secondary | ICD-10-CM | POA: Diagnosis not present

## 2024-04-02 DIAGNOSIS — Z01419 Encounter for gynecological examination (general) (routine) without abnormal findings: Secondary | ICD-10-CM | POA: Diagnosis not present

## 2024-05-01 ENCOUNTER — Ambulatory Visit (INDEPENDENT_AMBULATORY_CARE_PROVIDER_SITE_OTHER)

## 2024-05-01 DIAGNOSIS — B351 Tinea unguium: Secondary | ICD-10-CM

## 2024-05-01 NOTE — Progress Notes (Unsigned)
  Patient presents today for the 3rd laser treatment. Diagnosed with mycotic nail infection by Dr. Loel.    Toenails most affected left 1st, 4th and 5th.   All other systems are negative.   Nails were filed thin. Laser therapy was administered to 1st, 4th and 5th toenails left foot and patient tolerated the treatment well. All safety precautions were in place.    Patient had applied a moisturizer to her nails so she did notice the laser reacting to this again today. No visual injuries or burns were noted upon leaving office and no other symptoms reported by patient.  Patient is concerned about the slow growth of the left 1st, 4th and 5th nails.   Follow up in 6 weeks for laser # 4.

## 2024-06-04 DIAGNOSIS — Z860101 Personal history of adenomatous and serrated colon polyps: Secondary | ICD-10-CM | POA: Diagnosis not present

## 2024-06-04 DIAGNOSIS — Z09 Encounter for follow-up examination after completed treatment for conditions other than malignant neoplasm: Secondary | ICD-10-CM | POA: Diagnosis not present

## 2024-06-04 DIAGNOSIS — K52832 Lymphocytic colitis: Secondary | ICD-10-CM | POA: Diagnosis not present

## 2024-06-04 DIAGNOSIS — K5730 Diverticulosis of large intestine without perforation or abscess without bleeding: Secondary | ICD-10-CM | POA: Diagnosis not present

## 2024-06-04 DIAGNOSIS — K5010 Crohn's disease of large intestine without complications: Secondary | ICD-10-CM | POA: Diagnosis not present

## 2024-06-04 DIAGNOSIS — D120 Benign neoplasm of cecum: Secondary | ICD-10-CM | POA: Diagnosis not present

## 2024-06-12 ENCOUNTER — Ambulatory Visit (INDEPENDENT_AMBULATORY_CARE_PROVIDER_SITE_OTHER)

## 2024-06-12 DIAGNOSIS — B351 Tinea unguium: Secondary | ICD-10-CM

## 2024-06-12 NOTE — Progress Notes (Signed)
 Patient presents today for the 4th laser treatment. Diagnosed with mycotic nail infection by Dr. Loel.   Toenail most affected left 1st, 4th and 5th.  All other systems are negative.  Nails were filed thin. Laser therapy was administered to 1st, 4th and 5th toenails left foot and patient tolerated the treatment well. All safety precautions were in place. Though there were some nail polish remnants on nails and some reactivity from laser the patient reported no problems with sparks, lotions or nail polish remnants today.   Post treatment instructions reviewed and provided to patient. Patient had no questions regarding plan of care.   Follow up in 6 weeks for laser # 5.

## 2024-06-17 DIAGNOSIS — K5289 Other specified noninfective gastroenteritis and colitis: Secondary | ICD-10-CM | POA: Diagnosis not present

## 2024-06-18 DIAGNOSIS — E559 Vitamin D deficiency, unspecified: Secondary | ICD-10-CM | POA: Diagnosis not present

## 2024-06-18 DIAGNOSIS — M1990 Unspecified osteoarthritis, unspecified site: Secondary | ICD-10-CM | POA: Diagnosis not present

## 2024-06-18 DIAGNOSIS — M810 Age-related osteoporosis without current pathological fracture: Secondary | ICD-10-CM | POA: Diagnosis not present

## 2024-07-20 DIAGNOSIS — Z1231 Encounter for screening mammogram for malignant neoplasm of breast: Secondary | ICD-10-CM | POA: Diagnosis not present

## 2024-07-24 ENCOUNTER — Ambulatory Visit (INDEPENDENT_AMBULATORY_CARE_PROVIDER_SITE_OTHER): Payer: Self-pay

## 2024-07-24 DIAGNOSIS — B351 Tinea unguium: Secondary | ICD-10-CM

## 2024-07-24 NOTE — Progress Notes (Signed)
 Patient presents today for the 5th laser treatment. Diagnosed with mycotic nail infection by Dr. Loel.   Toenail most affected left 1st, 4th and 5th. The left 1st is showing significant improvement, the 4th and 5th are still showing some signs of yellowing and thickening. Much better than where we started from.   All other systems are negative.  Nails were filed thin. Laser therapy was administered to 1st, 4th and 5th toenails left foot and patient tolerated the treatment well. All safety precautions were in place.   Post treatment instructions reviewed and provided to patient. Patient had no questions regarding plan of care.   Follow up in 8 weeks for laser # 6.

## 2024-07-29 DIAGNOSIS — L989 Disorder of the skin and subcutaneous tissue, unspecified: Secondary | ICD-10-CM | POA: Diagnosis not present

## 2024-08-17 DIAGNOSIS — D485 Neoplasm of uncertain behavior of skin: Secondary | ICD-10-CM | POA: Diagnosis not present

## 2024-08-17 DIAGNOSIS — K5289 Other specified noninfective gastroenteritis and colitis: Secondary | ICD-10-CM | POA: Diagnosis not present

## 2024-08-19 DIAGNOSIS — K5289 Other specified noninfective gastroenteritis and colitis: Secondary | ICD-10-CM | POA: Diagnosis not present

## 2024-09-08 NOTE — Progress Notes (Addendum)
 " [facility] - Clinic Note  09/22/2024     CHIEF COMPLAINT Patient presents for Retina Follow Up   HISTORY OF PRESENT ILLNESS: Tina Woodard is a 63 y.o. female who presents to the clinic today for:   HPI     Retina Follow Up   Diagnosis:  Epiretinal membrane.  In both eyes.  This started 16 months ago.  Duration of 6 months.  Since onset it is stable.  I, the attending physician,  performed the HPI with the patient and updated documentation appropriately.        Comments   Pt states she still notices several floaters OU. Pt saw her optometrist recently and only had a slight change in Mrx. Pt denies FOL/pain. Pt uses ATS prn.       Last edited by Valdemar Rogue, MD on 09/24/2024  8:51 PM.     Patient states she she's stringy floaters and has had some issues w/ reading. No new distortion reported in [place].   Referring physician: Claudene Pellet, MD [phone] [address] [place],  [place] [address]  HISTORICAL INFORMATION:   Selected notes from the MEDICAL RECORD NUMBER Referred by Dr. Fleeta for concern of retinal hole LEE:  Ocular Hx- PMH-    CURRENT MEDICATIONS: No current outpatient medications on file. (Ophthalmic Drugs)   No current facility-administered medications for this visit. (Ophthalmic Drugs)   Current Outpatient Medications (Other)  Medication Sig   cholestyramine (QUESTRAN) 4 g packet Take 1 packet by mouth 2 (two) times daily.   Doxepin HCl 6 MG TABS Take 1 tablet by mouth at bedtime as needed.   acetaminophen  (TYLENOL ) 325 MG tablet Take 2 tablets (650 mg total) by mouth every 6 (six) hours as needed for mild pain. (Patient not taking: Reported on 11/11/2023)   cholecalciferol (VITAMIN D3) 25 MCG (1000 UNIT) tablet Take 1,000 Units by mouth daily.   MAGNESIUM PO Take 250-500 mg by mouth daily.   Multiple Vitamin (MULTIVITAMIN WITH MINERALS) TABS tablet Take 1 tablet by mouth daily.   oxyCODONE  (OXY IR/ROXICODONE ) 5 MG immediate  release tablet Take 1 tablet (5 mg total) by mouth every 6 (six) hours as needed for severe pain. (Patient not taking: Reported on 11/11/2023)   pravastatin (PRAVACHOL) 20 MG tablet Take 20 mg by mouth daily.   No current facility-administered medications for this visit. (Other)   REVIEW OF SYSTEMS: ROS   Positive for: Gastrointestinal, Musculoskeletal, Eyes Negative for: Constitutional, Neurological, Skin, Genitourinary, HENT, Endocrine, Cardiovascular, Respiratory, Psychiatric, Allergic/Imm, Heme/Lymph Last edited by Elnor Avelina RAMAN, COT on 09/22/2024  1:36 PM.      ALLERGIES No Known Allergies  PAST MEDICAL HISTORY Past Medical History:  Diagnosis Date   Hyperlipidemia    Osteoporosis    History reviewed. No pertinent surgical history.  FAMILY HISTORY Family History  Problem Relation Age of Onset   Diabetes Sister    Glaucoma Paternal Grandmother    Diabetes Paternal Grandmother    Glaucoma Paternal Grandfather    SOCIAL HISTORY Social History   Tobacco Use   Smoking status: Never  Vaping Use   Vaping status: Never Used  Substance Use Topics   Alcohol use: Yes    Comment: social   Drug use: Never       OPHTHALMIC EXAM:  Base Eye Exam     Visual Acuity (Snellen - Linear)       Right Left   Dist cc 20/30 +2 20/20  Dist ph cc NI NI    Correction: Glasses         Tonometry (Tonopen, 1:42 PM)       Right Left   Pressure 15 13         Pupils       Pupils Dark Light Shape React APD   Right PERRL 5 3 Round Brisk None   Left PERRL 5 3 Round Brisk None         Visual Fields       Left Right    Full Full         Extraocular Movement       Right Left    Full, Ortho Full, Ortho         Neuro/Psych     Oriented x3: Yes   Mood/Affect: Normal         Dilation     Both eyes: 1.0% Mydriacyl, 2.5% Phenylephrine @ 1:43 PM           Slit Lamp and Fundus Exam     Slit Lamp Exam       Right Left   Lids/Lashes  Dermatochalasis - upper lid, Meibomian gland dysfunction Dermatochalasis - upper lid, Meibomian gland dysfunction   Conjunctiva/Sclera white and quiet white and quiet, mild temporal pinguecula   Cornea mild arcus, mild tear film debris, trace PEE mild arcus, mild tear film debris, trace PEE   Anterior Chamber deep and clear deep and clear   Iris round and dilated round and dilated   Lens 2+ NS, 2-3+ cortical 2+ NS, 2-3+ cortical   Anterior Vitreous mild syneresis; PVD mild syneresis, PVD, weiss ring, vitreous condensations         Fundus Exam       Right Left   Disc pink and sharp, compact pink and sharp, compact, mild PPA   C/D Ratio 0.1 0.2   Macula flat, blunted foveal reflex, mild RPE mottling, no heme or edema, ERM with striae and central thickening, focal PRF superior macula flat, blunted foveal reflex, ERM with striae and lamellar hole, central cystic changes, PRF superior mac, no heme   Vessels mild attenuation, mild copper wiring, mild AV crossing changes attenuated, mild tortuosity   Periphery Attached, patches of pigmented lattice from 0500-0730 and at 1100; focal hypopigmented CR atrophy at 0800 equator, good laser surrounding all lesions, no new RT/RD/lattice Attached, pigmented lattice from 0530-0730 and at 1200; pigmented VR tuft at 1030, good laser changes surrounding all lesions, no new RT/RD/lattice           IMAGING AND PROCEDURES  Imaging and Procedures for 09/22/2024  OCT, Retina - OU - Both Eyes       Right Eye Quality was good. Central Foveal Thickness: 529. Progression has been stable. Findings include no IRF, no SRF, abnormal foveal contour, epiretinal membrane (ERM with blunting of foveal contour -- +central thickening and pucker; ?increase in ERM hyper reflectivity ).   Left Eye Quality was good. Central Foveal Thickness: 454. Progression has been stable. Findings include no SRF, abnormal foveal contour, epiretinal membrane, intraretinal fluid, lamellar  hole, preretinal fibrosis (Stable ERM with lamellar macular hole, +cystic changes, +pucker and PRF; inferior VR tuft caught on widefield -- not imaged today).   Notes *Images captured and stored on drive  Diagnosis / Impression:  OD: ERM with blunting of foveal contour -- +central thickening and pucker; ?increase in ERM hyper reflectivity OS: stable ERM with lamellar macular hole, +cystic changes, pucker and PRF--stable;  inferior VR tuft caught on widefield -- not imaged today   Clinical management:  See below  Abbreviations: NFP - Normal foveal profile. CME - cystoid macular edema. PED - pigment epithelial detachment. IRF - intraretinal fluid. SRF - subretinal fluid. EZ - ellipsoid zone. ERM - epiretinal membrane. ORA - outer retinal atrophy. ORT - outer retinal tubulation. SRHM - subretinal hyper-reflective material. IRHM - intraretinal hyper-reflective material              ASSESSMENT/PLAN:    ICD-10-CM   1. Epiretinal membrane (ERM) of both eyes  H35.373 OCT, Retina - OU - Both Eyes    2. 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 Retinal hole of both eyes  H33.323     5. Combined forms of age-related cataract of both eyes  H25.813      1,2. Epiretinal membrane OU (OD>OS) - OD: ERM with blunting of foveal contour -- +central thickening and pucker; ?increase in ERM hyper reflectivity  - OS: stable ERM with lamellar macular hole, +cystic changes, pucker and PRF--stable; inferior VR tuft caught on widefield -- not imaged today - BCVA 20/30 OD; 20/25 OS -- stable OU - asymptomatic, no metamorphopsia - no indication for surgery at this time - monitor  - f/u 6 mos, sooner prn- DFE/OCT  3,4. Lattice degeneration w/ atrophic holes OU - OD: pigmented lattice at 1100 and 0530 to 0700, and focal hypopigmented CR atrophy at 0800 equator - OS: pigmented lattice [phone], 0730 and 1200; pigmented VR tuft at 1030 -  s/p laser retinopexy OD (09.18.23) -- good laser surrounding all lesions - s/p laser retinopexy OS (10.11.23) -- good laser surrounding all lesions - no new RT/RD or lattice OU - monitor  5. Mixed cataracts OU - The symptoms of cataract, surgical options, and treatments and risks were discussed with patient. - discussed diagnosis and progression - not yet visually significant - monitor  Ophthalmic Meds Ordered this visit:  No orders of the defined types were placed in this encounter.    Return in about 6 months (around 03/22/2025) for ERM OU, DFE, OCT.  There are no Patient Instructions on file for this visit.  Explained the diagnoses, plan, and follow up with the patient and they expressed understanding.  Patient expressed understanding of the importance of proper follow up care.  This document serves as a record of services personally performed by Redell JUDITHANN Hans, MD, PhD. It was created on their behalf by Wanda GEANNIE Keens, COT an ophthalmic technician. The creation of this record is the provider's dictation and/or activities during the visit.    Electronically signed by:  Wanda GEANNIE Keens, COT  09/24/2024 8:54 PM  This document serves as a record of services personally performed by Redell JUDITHANN Hans, MD, PhD. It was created on their behalf by Almetta Pesa, an ophthalmic technician. The creation of this record is the provider's dictation and/or activities during the visit.    Electronically signed by: Almetta Pesa, OA, 09/24/2024  8:54 PM  Redell JUDITHANN Hans, M.D., Ph.D. Diseases & Surgery of the Retina and Vitreous [facility]  I have reviewed the above documentation for accuracy and completeness, and I agree with the above. Redell JUDITHANN Hans, M.D., Ph.D. 09/24/2024 8:54 PM   Abbreviations: M myopia (nearsighted); A astigmatism; H hyperopia (farsighted); P presbyopia; Mrx spectacle prescription;  CTL contact lenses; OD right eye; OS  left eye; OU both eyes   XT exotropia; ET esotropia; PEK punctate epithelial keratitis; PEE punctate epithelial erosions; DES dry eye syndrome; MGD meibomian gland dysfunction; ATs artificial tears; PFAT's preservative free artificial tears; NSC nuclear sclerotic cataract; PSC posterior subcapsular cataract; ERM epi-retinal membrane; PVD posterior vitreous detachment; RD retinal detachment; DM diabetes mellitus; DR diabetic retinopathy; NPDR non-proliferative diabetic retinopathy; PDR proliferative diabetic retinopathy; CSME clinically significant macular edema; DME diabetic macular edema; dbh dot blot hemorrhages; CWS cotton wool spot; POAG primary open angle glaucoma; C/D cup-to-disc ratio; HVF humphrey visual field; GVF goldmann visual field; OCT optical coherence tomography; IOP intraocular pressure; BRVO Branch retinal vein occlusion; CRVO central retinal vein occlusion; CRAO central retinal artery occlusion; BRAO branch retinal artery occlusion; RT retinal tear; SB scleral buckle; PPV pars plana vitrectomy; VH Vitreous hemorrhage; PRP panretinal laser photocoagulation; IVK intravitreal kenalog; VMT vitreomacular traction; MH Macular hole;  NVD neovascularization of the disc; NVE neovascularization elsewhere; AREDS age related eye disease study; ARMD age related macular degeneration; POAG primary open angle glaucoma; EBMD epithelial/anterior basement membrane dystrophy; ACIOL anterior chamber intraocular lens; IOL intraocular lens; PCIOL posterior chamber intraocular lens; Phaco/IOL phacoemulsification with intraocular lens placement; PRK photorefractive keratectomy; LASIK laser assisted in situ keratomileusis; HTN hypertension; DM diabetes mellitus; COPD chronic obstructive pulmonary disease "

## 2024-09-18 ENCOUNTER — Ambulatory Visit (INDEPENDENT_AMBULATORY_CARE_PROVIDER_SITE_OTHER): Payer: Self-pay

## 2024-09-18 DIAGNOSIS — B351 Tinea unguium: Secondary | ICD-10-CM

## 2024-09-18 NOTE — Progress Notes (Signed)
 Patient presents today for the 6th laser treatment. Diagnosed with mycotic nail infection by Dr. Loel.   Toenail most affected LT 1st, 4th and 5th.  All other systems are negative.  Nails were filed thin. Laser therapy was administered to 1st, 4th and 5th toenails left foot and patient tolerated the treatment well. All safety precautions were in place.   Post treatment instructions reviewed and provided to patient. Patient had no questions regarding plan of care.   Follow up in 3 months with provider.

## 2024-09-22 ENCOUNTER — Ambulatory Visit (INDEPENDENT_AMBULATORY_CARE_PROVIDER_SITE_OTHER): Admitting: Ophthalmology

## 2024-09-22 DIAGNOSIS — H25813 Combined forms of age-related cataract, bilateral: Secondary | ICD-10-CM | POA: Diagnosis not present

## 2024-09-22 DIAGNOSIS — H33323 Round hole, bilateral: Secondary | ICD-10-CM

## 2024-09-22 DIAGNOSIS — H35372 Puckering of macula, left eye: Secondary | ICD-10-CM

## 2024-09-22 DIAGNOSIS — H35342 Macular cyst, hole, or pseudohole, left eye: Secondary | ICD-10-CM

## 2024-09-22 DIAGNOSIS — H35373 Puckering of macula, bilateral: Secondary | ICD-10-CM

## 2024-09-22 DIAGNOSIS — H35413 Lattice degeneration of retina, bilateral: Secondary | ICD-10-CM

## 2024-09-24 ENCOUNTER — Encounter (INDEPENDENT_AMBULATORY_CARE_PROVIDER_SITE_OTHER): Payer: Self-pay | Admitting: Ophthalmology

## 2024-12-17 ENCOUNTER — Ambulatory Visit: Admitting: Podiatry

## 2025-03-17 ENCOUNTER — Encounter (INDEPENDENT_AMBULATORY_CARE_PROVIDER_SITE_OTHER): Admitting: Ophthalmology
